# Patient Record
Sex: Male | Born: 1965 | Race: White | Hispanic: No | Marital: Single | Smoking: Current every day smoker
Health system: Southern US, Community
[De-identification: ages and names within clinical notes are randomized; demographics above are authoritative.]

## PROBLEM LIST (undated history)

## (undated) DIAGNOSIS — F429 Obsessive-compulsive disorder, unspecified: Secondary | ICD-10-CM

## (undated) DIAGNOSIS — K219 Gastro-esophageal reflux disease without esophagitis: Secondary | ICD-10-CM

## (undated) DIAGNOSIS — F319 Bipolar disorder, unspecified: Secondary | ICD-10-CM

## (undated) DIAGNOSIS — K6289 Other specified diseases of anus and rectum: Secondary | ICD-10-CM

## (undated) HISTORY — DX: Gastro-esophageal reflux disease without esophagitis: K21.9

## (undated) HISTORY — PX: WISDOM TOOTH EXTRACTION: SHX21

## (undated) HISTORY — PX: COLONOSCOPY: SHX5424

## (undated) HISTORY — DX: Bipolar disorder, unspecified: F31.9

## (undated) HISTORY — DX: Obsessive-compulsive disorder, unspecified: F42.9

---

## 2006-07-16 ENCOUNTER — Emergency Department (HOSPITAL_COMMUNITY): Admission: EM | Admit: 2006-07-16 | Discharge: 2006-07-16 | Payer: Self-pay | Admitting: Family Medicine

## 2007-01-12 ENCOUNTER — Emergency Department (HOSPITAL_COMMUNITY): Admission: EM | Admit: 2007-01-12 | Discharge: 2007-01-12 | Payer: Self-pay | Admitting: Emergency Medicine

## 2007-06-17 ENCOUNTER — Emergency Department (HOSPITAL_COMMUNITY): Admission: EM | Admit: 2007-06-17 | Discharge: 2007-06-17 | Payer: Self-pay | Admitting: Emergency Medicine

## 2007-06-20 ENCOUNTER — Ambulatory Visit: Payer: Self-pay | Admitting: Family Medicine

## 2007-12-13 ENCOUNTER — Ambulatory Visit: Payer: Self-pay | Admitting: Family Medicine

## 2008-04-02 ENCOUNTER — Ambulatory Visit: Payer: Self-pay | Admitting: Family Medicine

## 2008-09-12 ENCOUNTER — Ambulatory Visit: Payer: Self-pay | Admitting: Family Medicine

## 2008-09-26 ENCOUNTER — Ambulatory Visit: Payer: Self-pay | Admitting: Family Medicine

## 2008-11-26 ENCOUNTER — Ambulatory Visit: Payer: Self-pay | Admitting: Family Medicine

## 2008-12-24 ENCOUNTER — Ambulatory Visit: Payer: Self-pay | Admitting: Family Medicine

## 2009-05-01 ENCOUNTER — Emergency Department (HOSPITAL_COMMUNITY): Admission: EM | Admit: 2009-05-01 | Discharge: 2009-05-01 | Payer: Self-pay | Admitting: Family Medicine

## 2009-05-03 ENCOUNTER — Ambulatory Visit: Payer: Self-pay | Admitting: Family Medicine

## 2009-07-15 ENCOUNTER — Ambulatory Visit: Payer: Self-pay | Admitting: Family Medicine

## 2009-08-07 ENCOUNTER — Ambulatory Visit: Payer: Self-pay | Admitting: Physician Assistant

## 2010-07-17 ENCOUNTER — Encounter: Payer: Self-pay | Admitting: Family Medicine

## 2010-11-25 LAB — POCT URINALYSIS DIP (DEVICE)
Nitrite: NEGATIVE
Urobilinogen, UA: 0.2
pH: 6.5

## 2010-11-25 LAB — POCT I-STAT, CHEM 8
Calcium, Ion: 1.2
Chloride: 103
HCT: 41
Potassium: 4.1
Sodium: 140

## 2010-11-25 LAB — DIFFERENTIAL
Basophils Absolute: 0.1
Basophils Relative: 1
Neutro Abs: 7.5
Neutrophils Relative %: 64

## 2010-11-25 LAB — CBC
MCHC: 34
Platelets: 234
RDW: 13

## 2011-02-04 ENCOUNTER — Encounter: Payer: Self-pay | Admitting: Medical

## 2011-02-04 ENCOUNTER — Ambulatory Visit (INDEPENDENT_AMBULATORY_CARE_PROVIDER_SITE_OTHER): Payer: BC Managed Care – PPO | Admitting: Medical

## 2011-02-04 VITALS — BP 122/82 | HR 68 | Temp 97.9°F | Resp 16 | Wt 172.0 lb

## 2011-02-04 DIAGNOSIS — B09 Unspecified viral infection characterized by skin and mucous membrane lesions: Secondary | ICD-10-CM

## 2011-02-04 DIAGNOSIS — Z113 Encounter for screening for infections with a predominantly sexual mode of transmission: Secondary | ICD-10-CM

## 2011-02-04 DIAGNOSIS — N529 Male erectile dysfunction, unspecified: Secondary | ICD-10-CM | POA: Insufficient documentation

## 2011-02-04 DIAGNOSIS — B088 Other specified viral infections characterized by skin and mucous membrane lesions: Secondary | ICD-10-CM

## 2011-02-04 LAB — COMPREHENSIVE METABOLIC PANEL
ALT: 12 U/L (ref 0–53)
CO2: 27 mEq/L (ref 19–32)
Chloride: 103 mEq/L (ref 96–112)
Potassium: 4.1 mEq/L (ref 3.5–5.3)
Sodium: 139 mEq/L (ref 135–145)
Total Bilirubin: 0.3 mg/dL (ref 0.3–1.2)
Total Protein: 7.3 g/dL (ref 6.0–8.3)

## 2011-02-04 LAB — CBC
MCH: 32.3 pg (ref 26.0–34.0)
MCV: 92.3 fL (ref 78.0–100.0)
Platelets: 306 10*3/uL (ref 150–400)
RDW: 12.5 % (ref 11.5–15.5)
WBC: 7 10*3/uL (ref 4.0–10.5)

## 2011-02-04 LAB — POCT URINALYSIS DIPSTICK
Bilirubin, UA: NEGATIVE
Blood, UA: NEGATIVE
Glucose, UA: NEGATIVE
Spec Grav, UA: <=1.005

## 2011-02-04 LAB — LIPID PANEL
Cholesterol: 211 mg/dL — ABNORMAL HIGH (ref 0–200)
Triglycerides: 107 mg/dL (ref <150)

## 2011-02-04 LAB — TESTOSTERONE: Testosterone: 304.83 ng/dL (ref 250–890)

## 2011-02-04 NOTE — Progress Notes (Signed)
Subjective:   HPI  Marcus Powell is a 45 y.o. male who presents for multiple c/o.  Last visit here early 2011.    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  They keep occuring with some frequency.  One will resolve after 1-2 wk, then he will get another.  Denies hx/o bowel disease, however he has had colonoscopy in 2010 for blood in stool. Denies hx/o cold sores, HIV or other immunocompromised state.   No other aggravating or relieving factors.  He reports problems with erections x months.  He still has some erections in the mornings, but they are not full.  Is able to get some erections with intercourse but not full, but sometimes not able to get erections at all.  Has never been evaluated for this.  Denies any changes or decrease in libido.  He is sexually active with women only.  He sees somebody regularly.  1 partner now, but hx/o multiple partners prior.  Denies prior STD screening.  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  No other c/o.  The following portions of the patient's history were reviewed and updated as appropriate: allergies, current medications, past family history, past medical history, past social history, past surgical history and problem list.   Review of Systems Constitutional: -fever, -chills, -sweats, -unexpected -weight change,-fatigue ENT: -runny nose, -ear pain, -sore throat Cardiology:  -chest pain, -palpitations, -edema Respiratory: -cough, -shortness of breath, -wheezing Gastroenterology: -abdominal pain, -nausea, -vomiting, -diarrhea, -constipation Hematology: -bleeding or bruising problems Musculoskeletal: -arthralgias, -myalgias, -joint swelling, -back pain Ophthalmology: -vision changes Urology: -dysuria, -difficulty urinating, -hematuria, -urinary frequency, -urgency Neurology: -headache, -weakness, -tingling, -numbness     Objective:   Physical Exam  Filed Vitals:   02/04/11 1459  BP: 122/82  Pulse: 68  Temp:  97.9 F (36.6 C)  Resp: 16    General appearance: alert, no distress, WD/WN, white male HEENT: normocephalic, sclerae anicteric, TMs pearly, nares patent, no discharge or erythema, pharynx normal Oral cavity: MMM, 1 ulcerated pink lesion 3mm x 2mm just inside left upper lip in oral mucosa Neck: supple, no lymphadenopathy, no thyromegaly, no masses Heart: RRR, normal S1, S2, no murmurs Lungs: CTA bilaterally, no wheezes, rhonchi, or rales Abdomen: +bs, soft, non tender, non distended, no masses, no hepatomegaly, no splenomegaly Pulses: 2+ symmetric GU: normal male external genitalia, no hernia, no mass, no lesions, no lymphadenopathy   Assessment and Plan :    Encounter Diagnoses  Name Primary?  . Viral infection characterized by skin and mucous membrane lesions Yes  . Erectile dysfunction   . Screen for STD (sexually transmitted disease)    Mouth ulcers likely aphthous ulcer.  Discussed usual course of these, advised salt water gargles, but no other specific treatment.    ED - labs today.  Discussed possible etiologies, and if labs normal, consider trial of Viagra.   Follow-up pending labs.

## 2011-02-05 ENCOUNTER — Other Ambulatory Visit: Payer: Self-pay | Admitting: Medical

## 2011-02-05 LAB — HSV(HERPES SIMPLEX VRS) I + II AB-IGM: Herpes Simplex Vrs I&II-IgM Ab (EIA): 0.31 INDEX

## 2011-02-05 LAB — GC/CHLAMYDIA PROBE AMP, URINE: Chlamydia, Swab/Urine, PCR: NEGATIVE

## 2011-02-05 LAB — HSV(HERPES SIMPLEX VRS) I + II AB-IGG: HSV 2 Glycoprotein G Ab, IgG: 0.19 IV

## 2011-02-05 MED ORDER — SILDENAFIL CITRATE 50 MG PO TABS: ORAL_TABLET | ORAL | Status: DC

## 2011-03-09 ENCOUNTER — Ambulatory Visit: Payer: BC Managed Care – PPO | Admitting: Medical

## 2011-04-29 ENCOUNTER — Ambulatory Visit (INDEPENDENT_AMBULATORY_CARE_PROVIDER_SITE_OTHER): Payer: BC Managed Care – PPO | Admitting: Medical

## 2011-04-29 ENCOUNTER — Encounter: Payer: Self-pay | Admitting: Medical

## 2011-04-29 VITALS — BP 130/90 | HR 60 | Temp 98.0°F | Resp 16 | Wt 178.0 lb

## 2011-04-29 DIAGNOSIS — Z726 Gambling and betting: Secondary | ICD-10-CM

## 2011-04-29 DIAGNOSIS — F429 Obsessive-compulsive disorder, unspecified: Secondary | ICD-10-CM

## 2011-04-29 DIAGNOSIS — F319 Bipolar disorder, unspecified: Secondary | ICD-10-CM | POA: Insufficient documentation

## 2011-04-29 DIAGNOSIS — F630 Pathological gambling: Secondary | ICD-10-CM

## 2011-04-29 MED ORDER — RISPERIDONE 0.5 MG PO TABS: 0.5000 mg | ORAL_TABLET | Freq: Two times a day (BID) | ORAL | Status: DC

## 2011-04-29 MED ORDER — FLUVOXAMINE MALEATE 50 MG PO TABS: 50.0000 mg | ORAL_TABLET | Freq: Every day | ORAL | Status: DC

## 2011-04-29 NOTE — Progress Notes (Signed)
  Subjective:   HPI  Marcus Powell is a 46 y.o. male who presents with concerns about his mood and need to get back on medication.   He notes a history of bipolar, OCD, and was hospitalized years ago at [facility].  At that time he saw Dr. Dub Mikes who started him on Luvox. He can't recall whether he was on other medications or not.  He has been on Wellbutrin for smoking cessation.  He had taken Luvox for quite some time and because he was doing so well he decided to stop the medication. This was back in 2011.  In the last few months, he notes that he has become irritable, has impulsivity particularly with gambling.  He has been gambling every weekend or when he has time.  This sometimes interferes with his financial obligations.  He has also had some depressed mood, mood swings, racing thoughts, lashes out at family and friends, decreased appetite, sleep is not good. However he denies going days without sleeping.  He denies any hallucinations or delusions. He denies any suicidal or homicidal behavior.  He is single, lives with his mom and sister.  He does have a girlfriend, and their relationship is fine. He notes that family and friends have told him that he needs to go be put on medication due to his irritability and mood of late.    He is a city [employer], is not exercising particularly. He denies any other prior psychiatric counseling evaluation in the past.  No other aggravating or relieving factors.    No other c/o.  The following portions of the patient's history were reviewed and updated as appropriate: allergies, current medications, past family history, past medical history, past social history, past surgical history and problem list.  Past Medical History  Diagnosis Date  . GERD (gastroesophageal reflux disease)   . Bipolar disorder     hx/o hospitalization - [facility]  . OCD (obsessive compulsive disorder)     No Known Allergies   Review of Systems ROS  reviewed and was negative other than noted in HPI or above.    Objective:   Physical Exam  General appearance: alert, no distress, WD/WN  psychiatric:  Answers questions or pearly, good eye contact, pleasant, calm  Assessment and Plan :     Encounter Diagnoses  Name Primary?  . Bipolar disorder Yes  . OCD (obsessive compulsive disorder)   . Gambling    I reviewed a pH q. 9 depression scale today with a score of 15, mood disorder questionnaire positive for 5 answers.  Discussed his symptoms and concerns, discussed medications, risk and benefits of medications.  We will start him initially on risperidone 0.5 mg twice a day.  After a week he can add on Luvox 50 mg each bedtime.  He will work on his behavior, irritability, and lashing out.  Advise that if he has any suicidal or homicidal thoughts or severely depressed mood, to call, return, or go to emergency department right away.   Also recommend he consider gambler's anonymous.  Handout given today. He will call report in 1 week and let me know how he is doing.  Return to clinic 2-3 wks.

## 2011-04-29 NOTE — Patient Instructions (Signed)
Begin Risperidone 0.5 mg, 1 tablet once daily for 3 days, then go to twice daily.   After being on this for an entire week at twice daily, and if not having any problems with this, then begin Luvox 50mg  tablet daily at bedtime.   I want you to call back in 1 week and let me know if you are having any problems with the medication.    Side effects can vary to none to nausea, and even strange or ever serious side effects.  If you notice anything not feeling right on the medication, call right away.   If you feel suicidal, feel like hurting somebody, or feel depressed, call or come in right away or go the emergency department.    Lets have you return in 2-3 weeks for recheck.   Consider calling Gamblers Anonymous for help.  Compulsive Gambling Compulsive gambling is gambling that becomes more and more hard to resist. Compulsive gambling is done at the expense of your job and your relationships, including your family and friends. It is done in spite of financial difficulties and inability to afford it. SIGNS OF COMPULSIVE GAMBLING  There may be increased frequency of gambling with higher and higher stakes.   There is a craving present which can only be satisfied by gambling again.   You develop a "rush" or warm, satisfied feeling when you are gambling.   There is a loss of self-esteem after you lose, yet you keep gambling to satisfy the craving.   You may resort to illegal activities to support the habit.  STAGES OF COMPULSIVE GAMBLING  The Winning Stage. This is where you win more often than you lose. It is likely a major, early win made you feel smarter than other gamblers. This winning justified your behavior. You believed you were a superior gambler or possibly even a "professional gambler."   The Losing Stage. Over time, you begin to bet higher and higher amounts. You begin to lose more often than you win, but you believe it is just a losing streak. With more losses, you bet even more in  an effort to win back the money you lost. You begin to borrow money for gambling and lie about losses. Others start refusing to help you out financially. Eventually, your problems become unmanageable and relationships with others begin to fall apart.   The Desperation Stage. By this time, you no longer have control of your gambling. You may engage in illegal behavior, such as stealing, to try to cover gambling debts. You continue to lie about the extent of your activities and start blaming others for your problems. You may find yourself living with deep anguish. You may think about suicide as a way to stop the pain. You may be forced into recovery by family, an employer, or the criminal justice system. You might enter a self-help recovery program. You could face relapses if you do not take the treatment program seriously. You spend most of your time:   Thinking about gambling.   Planning ways to get money to gamble.   Gambling.   The Hopeless Stage. If you do not get a strong recovery program working in your life, you may come to believe that nothing can help. You may feel like you do not care if you live or die. Depression deepens. At this time, a person either gets help or the problems could get worse, possibly ending in suicide or prison.  TREATMENT   Compulsive gambling is a common  problem. There are groups available for support, such as Gamblers Anonymous.   Psychotherapy may also be helpful.   Medicines are available which may help satisfy the craving to gamble.  Document Released: 11/11/2000 Document Revised: 10/29/2010 Document Reviewed: 01/11/2007 [facility] Patient Information 2012 [place], [place].

## 2011-05-11 ENCOUNTER — Encounter: Payer: Self-pay | Admitting: Internal Medicine

## 2011-05-19 ENCOUNTER — Encounter: Payer: Self-pay | Admitting: Medical

## 2011-05-19 ENCOUNTER — Other Ambulatory Visit: Payer: Self-pay | Admitting: Medical

## 2011-05-19 ENCOUNTER — Ambulatory Visit (INDEPENDENT_AMBULATORY_CARE_PROVIDER_SITE_OTHER): Payer: BC Managed Care – PPO | Admitting: Medical

## 2011-05-19 VITALS — BP 100/70 | HR 100 | Temp 97.8°F | Resp 16 | Wt 179.0 lb

## 2011-05-19 DIAGNOSIS — F319 Bipolar disorder, unspecified: Secondary | ICD-10-CM

## 2011-05-19 DIAGNOSIS — F429 Obsessive-compulsive disorder, unspecified: Secondary | ICD-10-CM

## 2011-05-19 DIAGNOSIS — J329 Chronic sinusitis, unspecified: Secondary | ICD-10-CM

## 2011-05-19 MED ORDER — AMOXICILLIN 875 MG PO TABS: 875.0000 mg | ORAL_TABLET | Freq: Two times a day (BID) | ORAL | Status: AC

## 2011-05-19 MED ORDER — FLUVOXAMINE MALEATE 25 MG PO TABS: ORAL_TABLET | ORAL | Status: DC

## 2011-05-19 NOTE — Progress Notes (Signed)
  Subjective:   HPI  Marcus Powell is a 46 y.o. male who presents with recheck.    Here for bad cold.   He notes 1+ week of sinus congestion, head and chest congestion, coughing up phlegm that is green.  Denies fever, but +chills.  He has had some nausea, right ear pain, sore throat.  Denies sick contacts.  Using some cough medication over the counter.    Here for recheck on mood swings, depressed mood, OCD.  At last visit I started him on risperidone 0.5 mg twice a day, and had him restart Luvox a week later.  He seems be doing well on the risperidone, has less irritability, less problems with his mood now, hasn't noticed any mood swings, no more racing thoughts.  However he still has impulsivity with the desire to gamble. He could not tolerate Luvox as it made him have no appetite at all.  Overall, he thinks he is doing better with the medication.  No other c/o.  The following portions of the patient's history were reviewed and updated as appropriate: allergies, current medications, past family history, past medical history, past social history, past surgical history and problem list.  Past Medical History  Diagnosis Date  . GERD (gastroesophageal reflux disease)   . Bipolar disorder     hx/o hospitalization - [facility]  . OCD (obsessive compulsive disorder)     No Known Allergies   Review of Systems ROS reviewed and was negative other than noted in HPI or above.    Objective:   Physical Exam  General appearance: alert, no distress, WD/WN  psychiatric:  Answers questions or pearly, good eye contact, pleasant, calm HEENT: mild sinus tenderness, TMs pearly, nares with clear discharge, no pharyngeal erythema Heart: RRR, normal S1, S2 Lungs: CTA bilat    Assessment and Plan :     Encounter Diagnoses  Name Primary?  . Bipolar disorder Yes  . OCD (obsessive compulsive disorder)   . Sinusitis    Bipolar - improving with Risperidone 0.5mg  BID.  Advise that if he has any  suicidal or homicidal thoughts or severely depressed mood, to call, return, or go to emergency department right away.   OCD - restart Luvox 25mg , 1/2 tablet daily.  Call report 2wk on how he is doing.   Call/return otherwise as needed.  Sinusitis - amoxicillin, Mucinex, rest, hydrate well

## 2011-06-05 ENCOUNTER — Telehealth: Payer: Self-pay | Admitting: Internal Medicine

## 2011-06-05 NOTE — Telephone Encounter (Signed)
1) call and see how he is doing on the Risperidone?  Does he see a big improvmeent in mood swings and urges to gamble? 2) does the medication make him sleepy?

## 2011-06-08 ENCOUNTER — Encounter: Payer: Self-pay | Admitting: Family Medicine

## 2011-06-08 ENCOUNTER — Telehealth: Payer: Self-pay | Admitting: Family Medicine

## 2011-06-08 NOTE — Telephone Encounter (Signed)
Patient states that the medication is fine and it is helping. He said the medication is not making him sleepy. He states that he doesn't have a problem with this medication. He needs a letter stating that it is all right for him to drive while taking this medication. CLS

## 2011-06-08 NOTE — Telephone Encounter (Signed)
Letter sent after Kristian Covey approval

## 2011-06-08 NOTE — Telephone Encounter (Signed)
done

## 2011-06-08 NOTE — Telephone Encounter (Signed)
Pt called stating he needs a letter that says his driving is not affected by the Resperidone or the Fluvoxamine and needs it faxed asap to [phone] Attn:  Katharina Caper.

## 2011-06-25 ENCOUNTER — Other Ambulatory Visit: Payer: Self-pay | Admitting: Medical

## 2011-06-25 NOTE — Telephone Encounter (Signed)
Is this ok?

## 2011-08-21 ENCOUNTER — Other Ambulatory Visit: Payer: Self-pay | Admitting: Medical

## 2011-08-21 NOTE — Telephone Encounter (Signed)
Needs recheck OV

## 2011-08-21 NOTE — Telephone Encounter (Signed)
RX REFILL 

## 2011-08-21 NOTE — Telephone Encounter (Signed)
PATIENT WAS NOTIFIED THAT HE NEEDED A OFFICE SO HE SCHEDULED AN APPOINTMENT FOR NEXT Tuesday.CLS

## 2011-08-21 NOTE — Telephone Encounter (Signed)
DONE

## 2011-08-25 ENCOUNTER — Ambulatory Visit (INDEPENDENT_AMBULATORY_CARE_PROVIDER_SITE_OTHER): Payer: BC Managed Care – PPO | Admitting: Medical

## 2011-08-25 ENCOUNTER — Encounter: Payer: Self-pay | Admitting: Medical

## 2011-08-25 VITALS — BP 120/80 | HR 92 | Temp 97.7°F | Resp 16 | Wt 180.0 lb

## 2011-08-25 DIAGNOSIS — F429 Obsessive-compulsive disorder, unspecified: Secondary | ICD-10-CM

## 2011-08-25 DIAGNOSIS — F3189 Other bipolar disorder: Secondary | ICD-10-CM

## 2011-08-25 DIAGNOSIS — F3181 Bipolar II disorder: Secondary | ICD-10-CM

## 2011-08-25 MED ORDER — RISPERIDONE 0.5 MG PO TABS: 0.5000 mg | ORAL_TABLET | Freq: Two times a day (BID) | ORAL | Status: DC

## 2011-08-25 NOTE — Progress Notes (Signed)
Subjective: Here at my request for f/u on bipolar, OCD and medication.  Since beginning Risperidone, he has done well.  He originally came to me in February for concerns about OCD and gambling, and hx/o bipolar with issues with sleep and depression at that time.  He has since done really well on the medication.  Currently, he denies gambling in quite some time (months), has no desire for gambling currently.  Mood is ok, denies depression currently.  Sleep is fine.  He denies agitation, isn't easily angered, and he is happy most of the time.  He doesn't let things get to him, takes things in stride compared to prior.  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  He lives with his mother and his girlfriend.  They have not commented on his behavior. Back in February his mother said he was mean and hateful, but in months she hasn't made these comments. Overall feels like the medication has been a big help.  No other new c/o.   Objective: Gen: wd, wn, nad Psych: pleasant, answers questions appropriately, good eye contact  Review of Systems Constitutional: -fever, -chills, -sweats, -unexpected -weight change,-fatigue Cardiology:  -chest pain, -palpitations, -edema Respiratory: -cough, -shortness of breath, -wheezing Gastroenterology: -abdominal pain, -nausea, -vomiting, -diarrhea, -constipation Hematology: -bleeding or bruising problems Musculoskeletal: -arthralgias, -myalgias, -joint swelling, -back pain Ophthalmology: -vision changes Urology: -dysuria, -difficulty urinating, -hematuria, -urinary frequency, -urgency Neurology: -headache, -weakness, -tingling, -numbness  Psych: no suicidal or homicidal ideation, no sleep problems  Assessment: Encounter Diagnoses  Name Primary?  . Bipolar 2 disorder Yes  . OCD (obsessive compulsive disorder)    Plan: Doing well on current medication regimen.  C/t Risperidone 0.5mg  BID.  Advised he eat 3 meals daily vs 2 meals daily, exercise  regularly.  He has access to gym for free through work.   Discussed medication f/u, avoiding gambling, and to return if issues with sleep, agitation, depression, etc.  F/u 138mo or sooner prn.  RTC 138mo for recheck and physical.

## 2011-08-26 ENCOUNTER — Encounter: Payer: Self-pay | Admitting: Medical

## 2011-09-24 ENCOUNTER — Other Ambulatory Visit: Payer: Self-pay | Admitting: Medical

## 2011-09-25 NOTE — Telephone Encounter (Signed)
Is this okay?

## 2011-12-22 ENCOUNTER — Other Ambulatory Visit: Payer: Self-pay | Admitting: Medical

## 2011-12-23 ENCOUNTER — Other Ambulatory Visit: Payer: Self-pay | Admitting: Medical

## 2011-12-23 NOTE — Telephone Encounter (Signed)
RX refill

## 2012-05-14 ENCOUNTER — Other Ambulatory Visit: Payer: Self-pay | Admitting: Medical

## 2012-05-16 NOTE — Telephone Encounter (Signed)
I left a message with the patients wife for the patient to call and schedule a OV. CLS

## 2012-05-16 NOTE — Telephone Encounter (Signed)
Needs OV recheck

## 2012-05-16 NOTE — Telephone Encounter (Signed)
Is this okay to refill? 

## 2013-05-12 ENCOUNTER — Encounter: Payer: Self-pay | Admitting: Medical

## 2013-05-12 ENCOUNTER — Ambulatory Visit (INDEPENDENT_AMBULATORY_CARE_PROVIDER_SITE_OTHER): Payer: BC Managed Care – PPO | Admitting: Medical

## 2013-05-12 VITALS — BP 122/80 | HR 78 | Temp 97.8°F | Resp 14 | Wt 183.0 lb

## 2013-05-12 DIAGNOSIS — F17200 Nicotine dependence, unspecified, uncomplicated: Secondary | ICD-10-CM

## 2013-05-12 DIAGNOSIS — R059 Cough, unspecified: Secondary | ICD-10-CM

## 2013-05-12 DIAGNOSIS — J329 Chronic sinusitis, unspecified: Secondary | ICD-10-CM

## 2013-05-12 DIAGNOSIS — F319 Bipolar disorder, unspecified: Secondary | ICD-10-CM

## 2013-05-12 DIAGNOSIS — R05 Cough: Secondary | ICD-10-CM

## 2013-05-12 MED ORDER — HYDROCODONE-HOMATROPINE 5-1.5 MG/5ML PO SYRP: 5.0000 mL | ORAL_SOLUTION | Freq: Three times a day (TID) | ORAL | Status: DC | PRN

## 2013-05-12 MED ORDER — AZELASTINE-FLUTICASONE 137-50 MCG/ACT NA SUSP: 1.0000 | Freq: Two times a day (BID) | NASAL | Status: DC

## 2013-05-12 MED ORDER — AMOXICILLIN 500 MG PO TABS: ORAL_TABLET | ORAL | Status: DC

## 2013-05-12 MED ORDER — RISPERIDONE 0.5 MG PO TABS: 0.5000 mg | ORAL_TABLET | Freq: Two times a day (BID) | ORAL | Status: DC

## 2013-05-12 NOTE — Progress Notes (Signed)
Subjective:  Marcus Powell is a 48 y.o. male who presents for cough and sinuses.  Symptoms include cough, coughing to the point of feeling like he is choking, sinuses blocked up, has had some sore throat, ear pressure, teeth pain, cough is productive, green sputum, sneezing.  Denies fever, NVD, no SOB or wheezing, no decreased sense of smell or taste.  No itchy or water eyes.  Patient is a smoker.  Using benadryl for symptoms.  Denies sick contacts.  Here with girlfriend.  He was doing well on Risperdal, but stopped it a while back as he "felt good."   No other aggravating or relieving factors.  No other c/o.  ROS as in subjective   Objective: Filed Vitals:   05/12/13 1054  BP: 122/80  Pulse: 78  Temp: 97.8 F (36.6 C)  Resp: 14    General appearance: Alert, WD/WN, no distress                             Skin: warm, no rash                           Head: +mild maxillary sinus tenderness,                            Eyes: conjunctiva normal, corneas clear, PERRLA                            Ears: pearly TMs, external ear canals normal                          Nose: septum midline, turbinates swollen, with erythema and clear discharge             Mouth/throat: MMM, tongue normal, mild pharyngeal erythema                           Neck: supple, no adenopathy, no thyromegaly, nontender                          Heart: RRR, normal S1, S2, no murmurs                         Lungs: CTA bilaterally, no wheezes, rales, or rhonchi      Assessment and Plan: Encounter Diagnoses  Name Primary?  . Sinusitis Yes  . Cough   . Smoker   . Bipolar disorder, unspecified    Sinusitis, cough - Prescription given for Amoxicillin.  Begin Dymista nasal spray, anthistmiane QHS, can use  Tylenol or Ibuprofen OTC for fever and malaise.  Can use Hycodan syrup for worse cough.  Advised he stop tobacco.  Discussed symptomatic relief, nasal saline flush, and call or return if worse or not improving in 2-3 days.     Bipolar - restart Risperdal, f/u in 2wk to discuss further and smoking cessation.

## 2013-05-26 ENCOUNTER — Ambulatory Visit (INDEPENDENT_AMBULATORY_CARE_PROVIDER_SITE_OTHER): Payer: BC Managed Care – PPO | Admitting: Medical

## 2013-05-26 ENCOUNTER — Telehealth: Payer: Self-pay | Admitting: Family Medicine

## 2013-05-26 ENCOUNTER — Encounter: Payer: Self-pay | Admitting: Medical

## 2013-05-26 VITALS — BP 110/80 | HR 80 | Temp 97.6°F | Resp 14 | Wt 184.0 lb

## 2013-05-26 DIAGNOSIS — F319 Bipolar disorder, unspecified: Secondary | ICD-10-CM

## 2013-05-26 DIAGNOSIS — J309 Allergic rhinitis, unspecified: Secondary | ICD-10-CM

## 2013-05-26 DIAGNOSIS — K219 Gastro-esophageal reflux disease without esophagitis: Secondary | ICD-10-CM

## 2013-05-26 DIAGNOSIS — F17200 Nicotine dependence, unspecified, uncomplicated: Secondary | ICD-10-CM

## 2013-05-26 MED ORDER — RISPERIDONE 0.5 MG PO TABS: 0.5000 mg | ORAL_TABLET | Freq: Two times a day (BID) | ORAL | Status: DC

## 2013-05-26 MED ORDER — OMEPRAZOLE 40 MG PO CPDR: 40.0000 mg | DELAYED_RELEASE_CAPSULE | Freq: Every day | ORAL | Status: DC

## 2013-05-26 MED ORDER — VARENICLINE TARTRATE 0.5 MG X 11 & 1 MG X 42 PO MISC: ORAL | Status: DC

## 2013-05-26 MED ORDER — BECLOMETHASONE DIPROPIONATE 80 MCG/ACT NA AERS: 2.0000 | INHALATION_SPRAY | Freq: Two times a day (BID) | NASAL | Status: DC

## 2013-05-26 NOTE — Addendum Note (Signed)
Addended by: Jac CanavanYSINGER, DAVID S on: 05/26/2013 11:11 AM   Modules accepted: Orders

## 2013-05-26 NOTE — Telephone Encounter (Signed)
Message copied by Janeice RobinsonSCALES, Ziaire Hagos L on Fri May 26, 2013  4:07 PM ------      Message from: Aleen CampiYSINGER, Kermit BaloAVID S      Created: Fri May 26, 2013 11:11 AM       I forgot to tell him this, but he can begin Omeprazole prescription once daily about 30-45 minutes before breakfast for heartburn/GERD.  Lets see how this helps.  Avoid or limit food triggers - citrus, spicy foods, acidic foods, caffeine.  ------

## 2013-05-26 NOTE — Telephone Encounter (Signed)
Patients wife is [facility]. CLS

## 2013-05-26 NOTE — Progress Notes (Addendum)
   Subjective:   Marcus Powell is a 48 y.o. male presenting on 05/26/2013 with Follow-up  Here for recheck, accompanied by spouse.     He did restart Risperdal and already spouse notes improvement in irritability and snapping at times.   He does get mood swings, sometimes down in mood.  When worse is introverted stays to himself. No legal problems, no hx/o mania.  In the past has always done well on medication.  No SI/HI.  Wants to go back on chantix for tobacco.  Has done well on this prior.  No prior problems with the medication.  Still having nose stopped up, coughing from post nasal drainage.  GERD - having issues with this for a while now, heartburn.   No other complaint.  Review of Systems ROS as in subjective      Objective:    Filed Vitals:   05/26/13 1035  BP: 110/80  Pulse: 80  Temp: 97.6 F (36.4 C)  Resp: 14    General appearance: alert, no distress, WD/WN HEENT: normocephalic, sclerae anicteric, TMs pearly, nares bilat turbinated edema, clear discharge, pharynx normal Oral cavity: MMM, no lesions Neck: supple, no lymphadenopathy, no thyromegaly, no masses Heart: RRR, normal S1, S2, no murmurs Lungs: CTA bilaterally, no wheezes, rhonchi, or rales Pulses: 2+ symmetric, upper and lower extremities, normal cap refill Psych: pleasant, good eye contact, answers questions approapriatley     Assessment: Encounter Diagnoses  Name Primary?  . Bipolar disorder, unspecified Yes  . Tobacco use disorder   . Allergic rhinitis   . GERD (gastroesophageal reflux disease)      Plan: Bipolar - begin back on Risperdal.  Discussed reasons for the medication, goals of medication, diagnosis of bipolar, primarily depression type.  F/u 1-3 mo.  Return sooner for any reason.  Tobacco use - begin back on Chantix, discussed risks/benefits of medication, potential dangers with bipolar and medication interactions with risperdal.   Spouse to also help watch for side effects.   F/u with call back in 2-3 wk, recheck 1-578mo  Allergic rhinitis - not much improvement on Dymista.  Change to Qnasal, c/t QHS antihistamine, avoid triggers, begin daily nasal saline flush  GERD - begin omeprazole  Marcus Powell was seen today for follow-up.  Diagnoses and associated orders for this visit:  Bipolar disorder, unspecified  Tobacco use disorder  Allergic rhinitis  GERD (gastroesophageal reflux disease)  Other Orders - Discontinue: risperiDONE (RISPERDAL) 0.5 MG tablet; Take 1 tablet (0.5 mg total) by mouth 2 (two) times daily. - varenicline (CHANTIX STARTING MONTH PAK) 0.5 MG X 11 & 1 MG X 42 tablet; Take one 0.5 mg tablet by mouth once daily for 3 days, then increase to one 0.5 mg tablet twice daily for 4 days, then increase to one 1 mg tablet twice daily. - Discontinue: risperiDONE (RISPERDAL) 0.5 MG tablet; Take 1 tablet (0.5 mg total) by mouth 2 (two) times daily. - Beclomethasone Dipropionate (QNASL) 80 MCG/ACT AERS; Place 2 sprays into the nose 2 (two) times daily. - risperiDONE (RISPERDAL) 0.5 MG tablet; Take 1 tablet (0.5 mg total) by mouth 2 (two) times daily. - risperiDONE (RISPERDAL) 0.5 MG tablet; Take 1 tablet (0.5 mg total) by mouth 2 (two) times daily. - omeprazole (PRILOSEC) 40 MG capsule; Take 1 capsule (40 mg total) by mouth daily.    Return in about 3 months (around 08/26/2013).

## 2013-05-26 NOTE — Patient Instructions (Signed)
YOU CAN QUIT SMOKING!  Talk to your medical provider about using medicines to help you quit. These include nicotine replacement gum, lozenges, or skin patches.  Consider calling 1-800-QUIT-NOW, a toll free 24/7 hotline with free counseling to help you quit.  If you are ready to quit smoking or are thinking about it, congratulations! You have chosen to help yourself be healthier and live longer! There are lots of different ways to quit smoking. Nicotine gum, nicotine patches, a nicotine inhaler, or nicotine nasal spray can help with physical craving. Hypnosis, support groups, and medicines help break the habit of smoking. TIPS TO GET OFF AND STAY OFF CIGARETTES  Learn to predict your moods. Do not let a bad situation be your excuse to have a cigarette. Some situations in your life might tempt you to have a cigarette.   Ask friends and co-workers not to smoke around you.   Make your home smoke-free.   Never have "just one" cigarette. It leads to wanting another and another. Remind yourself of your decision to quit.   On a card, make a list of your reasons for not smoking. Read it at least the same number of times a day as you have a cigarette. Tell yourself everyday, "I do not want to smoke. I choose not to smoke."   Ask someone at home or work to help you with your plan to quit smoking.   Have something planned after you eat or have a cup of coffee. Take a walk or get other exercise to perk you up. This will help to keep you from overeating.   Try a relaxation exercise to calm you down and decrease your stress. Remember, you may be tense and nervous the first two weeks after you quit. This will pass.   Find new activities to keep your hands busy. Play with a pen, coin, or rubber band. Doodle or draw things on paper.   Brush your teeth right after eating. This will help cut down the craving for the taste of tobacco after meals. You can try mouthwash too.   Try gum, breath mints, or diet  candy to keep something in your mouth.  IF YOU SMOKE AND WANT TO QUIT:  Do not stock up on cigarettes. Never buy a carton. Wait until one pack is finished before you buy another.   Never carry cigarettes with you at work or at home.   Keep cigarettes as far away from you as possible. Leave them with someone else.   Never carry matches or a lighter with you.   Ask yourself, "Do I need this cigarette or is this just a reflex?"   Bet with someone that you can quit. Put cigarette money in a piggy bank every morning. If you smoke, you give up the money. If you do not smoke, by the end of the week, you keep the money.   Keep trying. It takes 21 days to change a habit!  Document Released: 12/13/2008 Document Revised: 10/29/2010 Document Reviewed: 12/13/2008 [facility] Patient Information 2012 [place], [place].    Using Saline Nose Drops with Bulb Syringe A bulb syringe is used to clear your nose. You may use it when you have a stuffy nose, nasal congestion, sinus pressure, or sneezing.   SALINE SOLUTION You can buy nose drops at your local drug store. You can also make nose drops yourself. Mix 1 cup of water with  teaspoon of salt. Stir. Store this mixture at room temperature. Make a new batch  daily.  USE THE BULB IN COMBINATION WITH SALINE NOSE DROPS  Squeeze the air out of the bulb before suctioning the saline mixture.  While still squeezing the bulb flat, place the tip of the bulb into the saline mixture.  Let air come back into the bulb.  This will suction up the saline mixture.  Gently flush one nostril at a time.  Salt water nose drops will then moisten your  congested nose and loosen secretions before suctioning.  Use the bulb syringe as directed below to suction.  USING THE BULB SYRINGE TO SUCTION  While still squeezing the bulb flat, place the tip of the bulb into a nostril. Let air come back into the bulb. The suction will pull snot out of the nose and into the  bulb.  Repeat on the other nostril.  Squeeze syringe several times into a tissue.  CLEANING THE BULB SYRINGE  Clean the bulb syringe every day with hot soapy water.  Clean the inside of the bulb by squeezing the bulb while the tip is in soapy water.  Rinse by squeezing the bulb while the tip is in clean hot water.  Store the bulb with the tip side down on paper towel.  HOME CARE INSTRUCTIONS   Use saline nose drops often to keep the nose open and not stuffy.  Throw away used salt water. Make a new solution every time.  Do not use the same solution and dropper for another person  If you do not prefer to use nasal saline flush, other options include nasal saline spray or the [employer], both of which are available over the counter at your pharmacy.

## 2013-06-01 ENCOUNTER — Telehealth: Payer: Self-pay | Admitting: Medical

## 2013-06-01 NOTE — Telephone Encounter (Signed)
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  Unfortunately Chantix is a restricted medication on the list of things DOT doesn't allow.  I would recommend we not use the medication at this time.  If there will be a period of time when he will be out of work for a week or 2, we can restart it for short period of time.    As an alternative, adding Wellbutrin to his current medication may very well help reduce cravings for tobacco.  I can have him try this if agreeable.  I would like him to begin 1-800-QUIT-NOW counseling . I also would be ok with him using nicotine patches simultaneously with Wellbutrin and Risperidone if agreeable.   See if he would like to try this regimen?

## 2013-06-06 NOTE — Telephone Encounter (Signed)
Patient is aware of Marcus CoveyShane Powell [facility]AC message about the DOT and not being able to use Chantix. He was made aware of this. I went over the message in detail. CLS

## 2013-06-15 NOTE — Telephone Encounter (Signed)
Done  tsd

## 2013-06-19 ENCOUNTER — Emergency Department (HOSPITAL_COMMUNITY)
Admission: EM | Admit: 2013-06-19 | Discharge: 2013-06-19 | Disposition: A | Discharge status: Home / Self Care | Payer: BC Managed Care – PPO | Attending: Emergency Medicine | Admitting: Emergency Medicine

## 2013-06-19 ENCOUNTER — Encounter (HOSPITAL_COMMUNITY): Payer: Self-pay | Admitting: Emergency Medicine

## 2013-06-19 ENCOUNTER — Emergency Department (HOSPITAL_COMMUNITY): Payer: BC Managed Care – PPO

## 2013-06-19 DIAGNOSIS — F429 Obsessive-compulsive disorder, unspecified: Secondary | ICD-10-CM | POA: Insufficient documentation

## 2013-06-19 DIAGNOSIS — R0602 Shortness of breath: Secondary | ICD-10-CM

## 2013-06-19 DIAGNOSIS — R42 Dizziness and giddiness: Secondary | ICD-10-CM

## 2013-06-19 DIAGNOSIS — F319 Bipolar disorder, unspecified: Secondary | ICD-10-CM | POA: Insufficient documentation

## 2013-06-19 DIAGNOSIS — R05 Cough: Secondary | ICD-10-CM | POA: Insufficient documentation

## 2013-06-19 DIAGNOSIS — F17200 Nicotine dependence, unspecified, uncomplicated: Secondary | ICD-10-CM | POA: Insufficient documentation

## 2013-06-19 DIAGNOSIS — H7290 Unspecified perforation of tympanic membrane, unspecified ear: Secondary | ICD-10-CM | POA: Insufficient documentation

## 2013-06-19 DIAGNOSIS — Z79899 Other long term (current) drug therapy: Secondary | ICD-10-CM | POA: Insufficient documentation

## 2013-06-19 DIAGNOSIS — K219 Gastro-esophageal reflux disease without esophagitis: Secondary | ICD-10-CM | POA: Insufficient documentation

## 2013-06-19 DIAGNOSIS — R059 Cough, unspecified: Secondary | ICD-10-CM | POA: Insufficient documentation

## 2013-06-19 LAB — CBC WITH DIFFERENTIAL/PLATELET
Basophils Absolute: 0 10*3/uL (ref 0.0–0.1)
Basophils Relative: 0 % (ref 0–1)
Eosinophils Absolute: 0.5 10*3/uL (ref 0.0–0.7)
Eosinophils Relative: 5 % (ref 0–5)
HCT: 42 % (ref 39.0–52.0)
HEMOGLOBIN: 14.2 g/dL (ref 13.0–17.0)
Lymphocytes Relative: 26 % (ref 12–46)
Lymphs Abs: 2.6 10*3/uL (ref 0.7–4.0)
MCH: 31.8 pg (ref 26.0–34.0)
MCHC: 33.8 g/dL (ref 30.0–36.0)
MCV: 94 fL (ref 78.0–100.0)
MONOS PCT: 9 % (ref 3–12)
Monocytes Absolute: 0.9 10*3/uL (ref 0.1–1.0)
NEUTROS ABS: 6 10*3/uL (ref 1.7–7.7)
Neutrophils Relative %: 60 % (ref 43–77)
Platelets: 267 10*3/uL (ref 150–400)
RBC: 4.47 MIL/uL (ref 4.22–5.81)
RDW: 12.6 % (ref 11.5–15.5)
WBC: 10 10*3/uL (ref 4.0–10.5)

## 2013-06-19 LAB — COMPREHENSIVE METABOLIC PANEL
ALK PHOS: 29 U/L — AB (ref 39–117)
ALT: 48 U/L (ref 0–53)
AST: 22 U/L (ref 0–37)
Albumin: 3.9 g/dL (ref 3.5–5.2)
BUN: 17 mg/dL (ref 6–23)
CHLORIDE: 101 meq/L (ref 96–112)
CO2: 24 mEq/L (ref 19–32)
Calcium: 9.8 mg/dL (ref 8.4–10.5)
Creatinine, Ser: 0.93 mg/dL (ref 0.50–1.35)
GFR calc Af Amer: >90 mL/min (ref >90)
GFR calc non Af Amer: >90 mL/min (ref >90)
Glucose, Bld: 108 mg/dL — ABNORMAL HIGH (ref 70–99)
POTASSIUM: 3.8 meq/L (ref 3.7–5.3)
Sodium: 140 mEq/L (ref 137–147)
Total Protein: 7.4 g/dL (ref 6.0–8.3)

## 2013-06-19 LAB — URINALYSIS, ROUTINE W REFLEX MICROSCOPIC
BILIRUBIN URINE: NEGATIVE
GLUCOSE, UA: NEGATIVE mg/dL
Hgb urine dipstick: NEGATIVE
KETONES UR: NEGATIVE mg/dL
Leukocytes, UA: NEGATIVE
Nitrite: NEGATIVE
PROTEIN: NEGATIVE mg/dL
Specific Gravity, Urine: 1.012 (ref 1.005–1.030)
Urobilinogen, UA: 0.2 mg/dL (ref 0.0–1.0)
pH: 6.5 (ref 5.0–8.0)

## 2013-06-19 MED ORDER — SODIUM CHLORIDE 0.9 % IV BOLUS (SEPSIS)
1000.0000 mL | Freq: Once | INTRAVENOUS | Status: AC
  Administered 2013-06-19: 1000 mL via INTRAVENOUS

## 2013-06-19 NOTE — Discharge Instructions (Signed)
Follow up with your doctor in 2 days for reevaluation of symptoms. Return to Emergency department if dizziness reoccurs, or you develop any symptoms of Chest pain, shortness of breath, or the feeling that you may pass out.    Dizziness  Dizziness means you feel unsteady or lightheaded. You might feel like you are going to pass out (faint). HOME CARE   Drink enough fluids to keep your pee (urine) clear or pale yellow.  Take your medicines exactly as told by your doctor. If you take blood pressure medicine, always stand up slowly from the lying or sitting position. Hold on to something to steady yourself.  If you need to stand in one place for a long time, move your legs often. Tighten and relax your leg muscles.  Have someone stay with you until you feel okay.  Do not drive or use heavy machinery if you feel dizzy.  Do not drink alcohol. GET HELP RIGHT AWAY IF:   You feel dizzy or lightheaded and it gets worse.  You feel sick to your stomach (nauseous), or you throw up (vomit).  You have trouble talking or walking.  You feel weak or have trouble using your arms, hands, or legs.  You cannot think clearly or have trouble forming sentences.  You have chest pain, belly (abdominal) pain, sweating, or you are short of breath.  Your vision changes.  You are bleeding.  You have problems from your medicine that seem to be getting worse. MAKE SURE YOU:   Understand these instructions.  Will watch your condition.  Will get help right away if you are not doing well or get worse. Document Released: 02/05/2011 Document Revised: 05/11/2011 Document Reviewed: 02/05/2011 [facility] Patient Information 2014 [place], [place].

## 2013-06-19 NOTE — ED Notes (Addendum)
Pt has been dizzy for about 1-1.5 hours and increases with position changes and EMS reports orthostatic changes.. PT states on way to work started feeling light headed and sob.  No sickness.  152/84,initial, 138 palp standing.  SR.  [id number].  Pt denies any pain..  Pt states he felt light headed last nite, woke up and felt normal.  Denies any chest pain.  Reports long travel 2 weeks ago with 4-5 hour drive to and from, pt drives a bus daily for [employer]

## 2013-06-19 NOTE — ED Notes (Signed)
Patient transported to X-ray 

## 2013-06-19 NOTE — ED Provider Notes (Signed)
CSN: [id number]     Arrival date & time 06/19/13  [id number] History   First MD Initiated Contact with Patient 06/19/13 0544     Chief Complaint  Patient presents with  . Dizziness  . Shortness of Breath     (Consider location/radiation/quality/duration/timing/severity/associated sxs/prior Treatment) HPI 48 yo male presents with Dizziness that started this morning at 4am this morning on his way to work. 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 Patient admits to similar episodes in the past. He was told on previous occasions that his sxs were do to the "hypoglycemia". Patient describes acute onset dizziness. Patient describes a near syncopal event "everything got a little blurry". Episode lasted about 30 minutes. Dizziness is worse with standing. Patient also admits to some shortness of breath that occurred around 4:30am while he was walking up stairs "ten steps". This is patient's typcial routine and denies prior episodes of shorteness of breath. SOB resolved once sitting down. Patient denies any HA, CP, N/V, Diaphoresis, or palpitations. Patient denies any new medicaitions. Patient admits to bilateral ankle swelling the other night. Denies any hx of aneurysm or heart conditions.  Well's Criteria: Suspect DVT : (3) No PE most likely: (3) No HR > 100: (1.5) No Immobilization > 3 days or Surgery w/in 4 wks: (1.5) No Hx of DVT/PE: (1.5) No Hemoptysis: (1) No Hx of Cancer with tx w/in 6 mo: (1) No Total: 0  PERC Criteria: Age > 46 yo: No HR > 100 bpm: No O2 sat on RA < 95%: No Prior hx of DVT/PE:No Trauma or surgery in past 4 wks:No Hemoptysis:No Exogenous Estrogen/Testosterone use:No Unilateral Leg swelling: No  CAD Risk Factors: HTN: No Hyperlipidemia: No Cigarette smoking: Yes Diabetes Mellitus: No Family hx of CAD or MI < 46 yo : No Cocaine Use: No    Past Medical History  Diagnosis Date  . GERD (gastroesophageal reflux disease)   . Bipolar disorder     hx/o  hospitalization - [facility]  . OCD (obsessive compulsive disorder)    History reviewed. No pertinent past surgical history. No family history on file. History  Substance Use Topics  . Smoking status: Current Every Day Smoker    Last Attempt to Quit: 02/04/2008  . Smokeless tobacco: Not on file  . Alcohol Use: No    Review of Systems  Respiratory: Positive for cough (Chronic ). Negative for chest tightness and wheezing.   Gastrointestinal: Negative for abdominal pain.  All other systems reviewed and are negative.     Allergies  Review of patient's allergies indicates no known allergies.  Home Medications   Prior to Admission medications   Medication Sig Start Date End Date Taking? Authorizing Provider  Beclomethasone Dipropionate (QNASL) 80 MCG/ACT AERS Place 2 sprays into the nose 2 (two) times daily. 05/26/13   Kermit Baloavid S Tysinger, PA-C  omeprazole (PRILOSEC) 40 MG capsule Take 1 capsule (40 mg total) by mouth daily. 05/26/13   Kermit Baloavid S Tysinger, PA-C  risperiDONE (RISPERDAL) 0.5 MG tablet Take 1 tablet (0.5 mg total) by mouth 2 (two) times daily. 05/26/13   Kermit Baloavid S Tysinger, PA-C  risperiDONE (RISPERDAL) 0.5 MG tablet Take 1 tablet (0.5 mg total) by mouth 2 (two) times daily. 05/26/13   Kermit Baloavid S Tysinger, PA-C  varenicline (CHANTIX STARTING MONTH PAK) 0.5 MG X 11 & 1 MG X 42 tablet Take one 0.5 mg tablet by mouth once daily for 3 days, then increase to one 0.5 mg tablet twice daily for 4 days, then  increase to one 1 mg tablet twice daily. 05/26/13   Kermit Balo Tysinger, PA-C   BP 119/84  Pulse 73  Temp(Src) 97.4 F (36.3 C) (Oral)  Resp 20  Ht 6\' 3"  (1.905 m)  Wt 185 lb (83.915 kg)  BMI 23.12 kg/m2  SpO2 96% Physical Exam  Nursing note and vitals reviewed. Constitutional: He is oriented to person, place, and time. He appears well-developed and well-nourished. No distress.  HENT:  Head: Normocephalic and atraumatic.  Right Ear: Ear canal normal. A middle ear effusion is present.   Left Ear: Ear canal normal. A middle ear effusion is present.  Nose: Nose normal. Right sinus exhibits no maxillary sinus tenderness and no frontal sinus tenderness. Left sinus exhibits no maxillary sinus tenderness and no frontal sinus tenderness.  Mouth/Throat: Uvula is midline, oropharynx is clear and moist and mucous membranes are normal. No oropharyngeal exudate, posterior oropharyngeal edema or posterior oropharyngeal erythema.  Eyes: Conjunctivae and EOM are normal. Pupils are equal, round, and reactive to light. Right eye exhibits no discharge. Left eye exhibits no discharge. No scleral icterus.  Neck: Trachea normal, normal range of motion and phonation normal. Neck supple. No JVD present. Carotid bruit is not present. No rigidity. No tracheal deviation, no edema and no erythema present.  Cardiovascular: Normal rate, regular rhythm and intact distal pulses.  Exam reveals no gallop and no friction rub.   No murmur heard. Pulmonary/Chest: Effort normal and breath sounds normal. No stridor. No respiratory distress. He has no wheezes. He has no rhonchi. He has no rales.  Coarse breath sounds in lower right lobe   Abdominal: Soft. Bowel sounds are normal. He exhibits no distension. There is no tenderness.  Musculoskeletal: Normal range of motion. He exhibits no edema.  Lymphadenopathy:    He has no cervical adenopathy.  Neurological: He is alert and oriented to person, place, and time. He has normal strength. No cranial nerve deficit or sensory deficit.  Reflex Scores:      Bicep reflexes are 2+ on the right side and 2+ on the left side.      Patellar reflexes are 2+ on the right side and 2+ on the left side. CN II-XII grossly intact. Cerebellar function appears intact with finger to nose.   Skin: Skin is warm and dry. He is not diaphoretic.  Psychiatric: He has a normal mood and affect. His behavior is normal.    ED Course  Procedures (including critical care time) Labs Review Labs  Reviewed  COMPREHENSIVE METABOLIC PANEL - Abnormal; Notable for the following:    Glucose, Bld 108 (*)    Alkaline Phosphatase 29 (*)    Total Bilirubin <0.2 (*)    All other components within normal limits  CBC WITH DIFFERENTIAL  URINALYSIS, ROUTINE W REFLEX MICROSCOPIC    Imaging Review No results found.   EKG Interpretation None      MDM   Final diagnoses:  Dizziness  Shortness of breath    Patient afebrile.  Orthostatic BP WNL. HR noted to increase by > 20 bpm from sitting to standing.  CBC wnl. No evidence of anemia.  CMP wnl . No evidence of hypoglycemia.  Patient PERC negative. PE unlikely.  Patient has minimal risk factors for CAD, and denies any CP. Doubt ACS.  No hx or evidence of Heart failure on exam. Doubt CHF.  EKG wnl. No evidence of arrhythmias.  CXR negative.   6-15am - Plan to give patient fluids while he waits on lab  work. Will reassess after labs return .   8:00 am - patient reevaluated after IV fluids and snacks. Patient states he feels much better with no further episodes of dizziness. Plan to have patient follow up with his PCP in 2 days for further evaluation. Return precautions given should patient dizziness reoccur or develop chest pain or shortness of breath. Patient agrees with plan. Discharged in good condition.   Meds given in ED:  Medications  sodium chloride 0.9 % bolus 1,000 mL (0 mLs Intravenous Stopped 06/19/13 0755)  sodium chloride 0.9 % bolus 1,000 mL (1,000 mLs Intravenous New Bag/Given 06/19/13 0756)    New Prescriptions   No medications on file     Rudene AndaJacob Gray Kanyia Heaslip, PA-C 06/20/13 1600

## 2013-06-27 NOTE — ED Provider Notes (Signed)
Medical screening examination/treatment/procedure(s) were conducted as a shared visit with non-physician practitioner(s) and myself.  I personally evaluated the patient during the encounter.   EKG Interpretation None        Loren Raceravid Kealey Kemmer, MD 06/27/13 [phone]

## 2013-07-07 ENCOUNTER — Other Ambulatory Visit: Payer: Self-pay | Admitting: Medical

## 2013-07-07 NOTE — Telephone Encounter (Signed)
Is this okay to refill? 

## 2013-08-14 ENCOUNTER — Encounter (HOSPITAL_COMMUNITY): Payer: Self-pay | Admitting: Emergency Medicine

## 2013-08-14 ENCOUNTER — Emergency Department (HOSPITAL_COMMUNITY)
Admission: EM | Admit: 2013-08-14 | Discharge: 2013-08-14 | Disposition: A | Discharge status: Home / Self Care | Payer: BC Managed Care – PPO | Source: Home / Self Care

## 2013-08-14 DIAGNOSIS — T148XXA Other injury of unspecified body region, initial encounter: Secondary | ICD-10-CM

## 2013-08-14 DIAGNOSIS — IMO0002 Reserved for concepts with insufficient information to code with codable children: Secondary | ICD-10-CM

## 2013-08-14 DIAGNOSIS — M25511 Pain in right shoulder: Secondary | ICD-10-CM

## 2013-08-14 DIAGNOSIS — M25519 Pain in unspecified shoulder: Secondary | ICD-10-CM

## 2013-08-14 DIAGNOSIS — S46911A Strain of unspecified muscle, fascia and tendon at shoulder and upper arm level, right arm, initial encounter: Secondary | ICD-10-CM

## 2013-08-14 DIAGNOSIS — X58XXXA Exposure to other specified factors, initial encounter: Secondary | ICD-10-CM

## 2013-08-14 DIAGNOSIS — X503XXA Overexertion from repetitive movements, initial encounter: Secondary | ICD-10-CM

## 2013-08-14 MED ORDER — HYDROCODONE-ACETAMINOPHEN 5-325 MG PO TABS: ORAL_TABLET | ORAL | Status: DC

## 2013-08-14 MED ORDER — KETOROLAC TROMETHAMINE 60 MG/2ML IM SOLN
INTRAMUSCULAR | Status: AC
  Filled 2013-08-14: qty 2

## 2013-08-14 MED ORDER — MELOXICAM 15 MG PO TABS: 15.0000 mg | ORAL_TABLET | Freq: Every day | ORAL | Status: DC

## 2013-08-14 MED ORDER — KETOROLAC TROMETHAMINE 60 MG/2ML IM SOLN
60.0000 mg | Freq: Once | INTRAMUSCULAR | Status: AC
  Administered 2013-08-14: 60 mg via INTRAMUSCULAR

## 2013-08-14 MED ORDER — DICLOFENAC SODIUM 1 % TD GEL: 1.0000 "application " | Freq: Four times a day (QID) | TRANSDERMAL | Status: DC

## 2013-08-14 NOTE — ED Provider Notes (Signed)
CSN: [id number]     Arrival date & time 08/14/13  1253 History   First MD Initiated Contact with Patient 08/14/13 1505     Chief Complaint  Patient presents with  . Shoulder Pain   (Consider location/radiation/quality/duration/timing/severity/associated sxs/prior Treatment) HPI Comments: 48 year old male presents with a complaint of right shoulder pain for one week. The pain is located to the medial aspect of the right scapula and the right anterior chest near the anterior joint line. 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 This seems to exacerbate the pain. Denies any known event or accident that caused the pain.   Past Medical History  Diagnosis Date  . GERD (gastroesophageal reflux disease)   . Bipolar disorder     hx/o hospitalization - [facility]  . OCD (obsessive compulsive disorder)    History reviewed. No pertinent past surgical history. History reviewed. No pertinent family history. History  Substance Use Topics  . Smoking status: Current Every Day Smoker    Last Attempt to Quit: 02/04/2008  . Smokeless tobacco: Not on file  . Alcohol Use: No    Review of Systems  Constitutional: Positive for activity change. Negative for fever and fatigue.  Respiratory: Negative.   Cardiovascular: Negative.   Gastrointestinal: Negative.   Genitourinary: Negative.   Musculoskeletal:       As per HPI  Skin: Negative.   Neurological: Negative for dizziness, weakness, numbness and headaches.    Allergies  Review of patient's allergies indicates no known allergies.  Home Medications   Prior to Admission medications   Medication Sig Start Date End Date Taking? Authorizing Provider  azelastine (ASTELIN) 137 MCG/SPRAY nasal spray Place 1 spray into both nostrils 2 (two) times daily. Use in each nostril as directed    Historical Provider, MD  diclofenac sodium (VOLTAREN) 1 % GEL Apply 1 application topically 4 (four) times daily. 08/14/13   Hayden Rasmussenavid Elisha Cooksey, NP   HYDROcodone-acetaminophen (NORCO/VICODIN) 5-325 MG per tablet Take 1 tab q4h at night time prn pain. May cause drowsiness. 08/14/13   Hayden Rasmussenavid Selin Eisler, NP  meloxicam (MOBIC) 15 MG tablet Take 1 tablet (15 mg total) by mouth daily. 08/14/13   Hayden Rasmussenavid Marci Polito, NP  omeprazole (PRILOSEC) 40 MG capsule Take 40 mg by mouth daily.    Historical Provider, MD  risperiDONE (RISPERDAL) 0.5 MG tablet TAKE 1 TABLET (0.5 MG TOTAL) BY MOUTH 2 (TWO) TIMES DAILY.    Kermit Baloavid S Tysinger, PA-C   BP 121/84  Pulse 66  Temp(Src) 97.9 F (36.6 C) (Oral)  Resp 16  SpO2 99% Physical Exam  Nursing note and vitals reviewed. Constitutional: He is oriented to person, place, and time. He appears well-developed and well-nourished.  HENT:  Head: Normocephalic and atraumatic.  Eyes: EOM are normal.  Neck: Normal range of motion. Neck supple.  Pulmonary/Chest: Effort normal. No respiratory distress.  Musculoskeletal:  Full ROM of shoulder. No swelling, deformity or discoloration. Tenderness to the R rhomboid and para thoracic musculature.  Tenderness to the R anterior upper chest pectoral muscle and tendons that insert across the anterior joint line. No joint line tenderness. Distal N/V, M/s intact . Radial pulse 2+  Neurological: He is alert and oriented to person, place, and time. No cranial nerve deficit.  Skin: Skin is warm and dry.  Psychiatric: He has a normal mood and affect.    ED Course  Procedures (including critical care time) Labs Review Labs Reviewed - No data to display  Imaging Review No results  found.   MDM   1. Right anterior shoulder pain   2. 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 Muscle strain of right shoulder region   4. Muscle strain of right scapular region     Diclofenac gel qid mobic 15 mg norco 5 mg at home, not work. #15 toradol 60 mg IM Heat to back and ice to anterior shoulder. 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 F/U  PCP and get a referral to PT as needed.    Hayden Rasmussenavid Chester Romero, NP 08/14/13 [phone]

## 2013-08-14 NOTE — Discharge Instructions (Signed)
Repetitive Strain Injuries °Repetitive strain injuries (RSIs) result from overuse or misuse of soft tissues including muscles, tendons, or nerves. Tendons are the cord-like structures that attach muscles to bones. RSIs can affect almost any part of the body. However, RSIs are most common in the arms (thumbs, wrists, elbows, shoulders) and legs (ankles, knees). Common medical conditions that are often caused by repetitive strain include carpal tunnel syndrome, tennis or golfer's elbow, bursitis, and tendonitis. If RSIs are treated early, and the repeated activity is reduced or removed, the severity and length of your problems can usually be reduced. RSIs are also called cumulative trauma disorders (CTD).  °CAUSES  °Many RSIs occur due to repeating the same activity at work over weeks or months without sufficient rest, such as prolonged typing. RSIs also commonly occur when a hobby or sport is done repeatedly without sufficient rest. RSIs can also occur due to repeated strain or stress on a body part in someone who has one or more risk factors for RSIs. °RISK FACTORS °Workplace risk factors °· Frequent computer use, especially if your workstation is not adjusted for your body type. °· Infrequent rest breaks. °· Working in a high-pressure environment. °· Working at a fast pace. °· Repeating the same motion, such as frequent typing. °· Working in an awkward position or holding the same position for a long time. °· Forceful movements such as lifting, pulling, or pushing. °· Vibration caused by using power tools. °· Working in cold temperatures. °· Job stress. °Personal risk factors °· Poor posture. °· Being loose-jointed. °· Not exercising regularly. °· Being overweight. °· Arthritis, diabetes, thyroid problems, or other long-term (chronic) medical conditions. °· Vitamin deficiencies. °· Keeping your fingernails long. °· An unhealthy, stressful, or inactive lifestyle. °· Not sleeping well. °SYMPTOMS  °Symptoms often  begin at work but become more noticeable after the repeated stress has ended. For example, you may develop fatigue or soreness in your  wrist while typing at work, and at night you may develop numbness and tingling in your fingers. Common symptoms include:  °· Burning, shooting, or aching pain, especially in the fingers, palms, wrists, forearms, or shoulders. °· Tenderness. °· Swelling. °· Tingling, numbness, or loss of feeling. °· Pain with certain activities, such as turning a doorknob or reaching above your head. °· Weakness, heaviness, or loss of coordination in your hand. °· Muscle spasms or tightness. °In some cases, symptoms can become so intense that it is difficult to perform everyday tasks. Symptoms that do not improve with rest may indicate a more serious condition.  °DIAGNOSIS  °Your caregiver may determine the type of RSI you have based on your medical evaluation and a description of your activities.  °TREATMENT  °Treatment depends on the severity and type of RSI you have. Your caregiver may recommend rest for the affected body part, medicines, and physical or occupational therapy to reduce pain, swelling, and soreness. Discuss the activities you do repeatedly with your caregiver. Your caregiver can help you decide whether you need to change your activities. An RSI may take months or years to heal, especially if the affected body part gets insufficient rest. In some cases, such as severe carpal tunnel syndrome, surgery may be recommended. °PREVENTION °· Talk with your supervisor to make sure you have the proper equipment for your work station. °· Maintain good posture at your desk or work station with: °· Feet flat on the floor. °· Knees directly over the feet, bent at a right angle. °· Lower back supported by your chair or a   cushion in the curve of your lower back.  Shoulders and arms relaxed and at your sides.  Neck relaxed and not bent forwards or backwards.  Your desk and computer workstation  properly adjusted to your body type.  Your chair adjusted so there is no excess pressure on the back of your thighs.  The keyboard resting above your thighs. You should be able to reach the keys with your elbows at your side, bent at a right angle. Your arms should be supported on forearm rests, with your forearms parallel to the ground.  The computer mouse within easy reach.  The monitor directly in front of you, so that your eyes are aligned with the top of the screen. The screen should be about 15 to 25 inches from your eyes.  While typing, keep your wrist straight, in a neutral position. Move your entire arm when you move your mouse or when typing hard-to-reach keys.  Only use your computer as much as you need to for work. Do not use it during breaks.  Take breaks often from any repeated activity. Alternate with another task which requires you to use different muscles, or rest at least once every hour.  Change positions regularly. If you spend a lot of time sitting, get up, walk around, and stretch.  Do not hold pens or pencils tightly when writing.  Exercise regularly.  Maintain a normal weight.  Eat a diet with plenty of vegetables, whole grains, and fruit.  Get sufficient, restful sleep. HOME CARE INSTRUCTIONS  If your caregiver prescribed medicine to help reduce swelling, take it as directed.  Only take over-the-counter or prescription medicines for pain, discomfort, or fever as directed by your caregiver.  Reduce, and if needed, stopthe activities that are causing your problems until you have no further symptoms.If your symptoms are work-related, you may need to talk to your supervisor about changing your activities.  When symptoms develop, put ice or a cold pack on the aching area.  Put ice in a plastic bag.  Place a towel between your skin and the bag.  Leave the ice on for 15-20 minutes.  If you were given a splint to keep your wrist from bending, wear it as  instructed. It is important to wear the splint at night. Use the splint for as long as your caregiver recommends. SEEK MEDICAL CARE IF:  You develop new problems.  Your problems do not get better with medicine. MAKE SURE YOU:  Understand these instructions.  Will watch your condition.  Will get help right away if you are not doing well or get worse. Document Released: 02/06/2002 Document Revised: 08/18/2011 Document Reviewed: 04/09/2011 [facility] Patient Information 2014 [place], [place].  Shoulder Pain Your problem does not appear to be in the joint, but surrounding it. The shoulder is the joint that connects your arms to your body. The bones that form the shoulder joint include the upper arm bone (humerus), the shoulder blade (scapula), and the collarbone (clavicle). The top of the humerus is shaped like a ball and fits into a rather flat socket on the scapula (glenoid cavity). A combination of muscles and strong, fibrous tissues that connect muscles to bones (tendons) support your shoulder joint and hold the ball in the socket. Small, fluid-filled sacs (bursae) are located in different areas of the joint. They act as cushions between the bones and the overlying soft tissues and help reduce friction between the gliding tendons and the bone as you move your arm. Your shoulder  joint allows a wide range of motion in your arm. This range of motion allows you to do things like scratch your back or throw a ball. However, this range of motion also makes your shoulder more prone to pain from overuse and injury. Causes of shoulder pain can originate from both injury and overuse and usually can be grouped in the following four categories:  Redness, swelling, and pain (inflammation) of the tendon (tendinitis) or the bursae (bursitis).  Instability, such as a dislocation of the joint.  Inflammation of the joint (arthritis).  Broken bone (fracture). HOME CARE INSTRUCTIONS   Apply ice to the sore  area.  Put ice in a plastic bag.  Place a towel between your skin and the bag.  Leave the ice on for 15-20 minutes, 03-04 times per day for the first 2 days.  Stop using cold packs if they do not help with the pain.  If you have a shoulder sling or immobilizer, wear it as long as your caregiver instructs. Only remove it to shower or bathe. Move your arm as little as possible, but keep your hand moving to prevent swelling.  Squeeze a soft ball or foam pad as much as possible to help prevent swelling.  Only take over-the-counter or prescription medicines for pain, discomfort, or fever as directed by your caregiver. SEEK MEDICAL CARE IF:   Your shoulder pain increases, or new pain develops in your arm, hand, or fingers.  Your hand or fingers become cold and numb.  Your pain is not relieved with medicines. SEEK IMMEDIATE MEDICAL CARE IF:   Your arm, hand, or fingers are numb or tingling.  Your arm, hand, or fingers are significantly swollen or turn white or blue. MAKE SURE YOU:   Understand these instructions.  Will watch your condition.  Will get help right away if you are not doing well or get worse. Document Released: 11/26/2004 Document Revised: 11/11/2011 Document Reviewed: 01/31/2011 [facility]Exit[facility] Patient Information 2014 [place] SpringsExitCare, MarylandLL[place].

## 2013-08-14 NOTE — ED Notes (Signed)
C/o right shoulder pain for a week now Denies any injury States has been coughing a lot lately which is slightly productive

## 2013-08-15 ENCOUNTER — Telehealth: Payer: Self-pay | Admitting: Family Medicine

## 2013-08-15 NOTE — ED Provider Notes (Signed)
Medical screening examination/treatment/procedure(s) were performed by resident physician or non-physician practitioner and as supervising physician I was immediately available for consultation/collaboration.   KINDL,JAMES DOUGLAS MD.   James D Kindl, MD 08/15/13 1003 

## 2013-08-15 NOTE — Telephone Encounter (Signed)
ER letter sent 

## 2013-08-17 NOTE — Telephone Encounter (Signed)
dt ?

## 2013-10-12 ENCOUNTER — Other Ambulatory Visit: Payer: Self-pay | Admitting: Medical

## 2014-06-12 ENCOUNTER — Encounter (HOSPITAL_COMMUNITY): Payer: Self-pay | Admitting: Emergency Medicine

## 2014-06-12 ENCOUNTER — Emergency Department (HOSPITAL_COMMUNITY)
Admission: EM | Admit: 2014-06-12 | Discharge: 2014-06-12 | Disposition: A | Discharge status: Home / Self Care | Payer: BLUE CROSS/BLUE SHIELD | Source: Home / Self Care | Attending: Family Medicine | Admitting: Family Medicine

## 2014-06-12 DIAGNOSIS — M25512 Pain in left shoulder: Secondary | ICD-10-CM

## 2014-06-12 MED ORDER — CYCLOBENZAPRINE HCL 10 MG PO TABS: 10.0000 mg | ORAL_TABLET | Freq: Two times a day (BID) | ORAL | Status: DC | PRN

## 2014-06-12 MED ORDER — MELOXICAM 15 MG PO TABS: 15.0000 mg | ORAL_TABLET | Freq: Every day | ORAL | Status: DC

## 2014-06-12 NOTE — ED Notes (Signed)
Pt has c/o numbness and tingling intermittently in his left upper arm that has been going on since Friday.  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  The arm becomes heavy and it gets numb and he experiences some SOB when the pain increases.

## 2014-06-12 NOTE — Discharge Instructions (Signed)
Musculoskeletal Pain °Musculoskeletal pain is muscle and boney aches and pains. These pains can occur in any part of the body. Your caregiver may treat you without knowing the cause of the pain. They may treat you if blood or urine tests, X-rays, and other tests were normal.  °CAUSES °There is often not a definite cause or reason for these pains. These pains may be caused by a type of germ (virus). The discomfort may also come from overuse. Overuse includes working out too hard when your body is not fit. Boney aches also come from weather changes. Bone is sensitive to atmospheric pressure changes. °HOME CARE INSTRUCTIONS  °· Ask when your test results will be ready. Make sure you get your test results. °· Only take over-the-counter or prescription medicines for pain, discomfort, or fever as directed by your caregiver. If you were given medications for your condition, do not drive, operate machinery or power tools, or sign legal documents for 24 hours. Do not drink alcohol. Do not take sleeping pills or other medications that may interfere with treatment. °· Continue all activities unless the activities cause more pain. When the pain lessens, slowly resume normal activities. Gradually increase the intensity and duration of the activities or exercise. °· During periods of severe pain, bed rest may be helpful. Lay or sit in any position that is comfortable. °· Putting ice on the injured area. °¨ Put ice in a bag. °¨ Place a towel between your skin and the bag. °¨ Leave the ice on for 15 to 20 minutes, 3 to 4 times a day. °· Follow up with your caregiver for continued problems and no reason can be found for the pain. If the pain becomes worse or does not go away, it may be necessary to repeat tests or do additional testing. Your caregiver may need to look further for a possible cause. °SEEK IMMEDIATE MEDICAL CARE IF: °· You have pain that is getting worse and is not relieved by medications. °· You develop chest pain  that is associated with shortness or breath, sweating, feeling sick to your stomach (nauseous), or throw up (vomit). °· Your pain becomes localized to the abdomen. °· You develop any new symptoms that seem different or that concern you. °MAKE SURE YOU:  °· Understand these instructions. °· Will watch your condition. °· Will get help right away if you are not doing well or get worse. °Document Released: 02/16/2005 Document Revised: 05/11/2011 Document Reviewed: 10/21/2012 °ExitCare® Patient Information ©2015 [employer] LLC. This information is not intended to replace advice given to you by your health care provider. Make sure you discuss any questions you have with your health care provider. °Shoulder Pain °The shoulder is the joint that connects your arms to your body. The bones that form the shoulder joint include the upper arm bone (humerus), the shoulder blade (scapula), and the collarbone (clavicle). The top of the humerus is shaped like a ball and fits into a rather flat socket on the scapula (glenoid cavity). A combination of muscles and strong, fibrous tissues that connect muscles to bones (tendons) support your shoulder joint and hold the ball in the socket. Small, fluid-filled sacs (bursae) are located in different areas of the joint. They act as cushions between the bones and the overlying soft tissues and help reduce friction between the gliding tendons and the bone as you move your arm. Your shoulder joint allows a wide range of motion in your arm. This range of motion allows you to do things   like scratch your back or throw a ball. However, this range of motion also makes your shoulder more prone to pain from overuse and injury. °Causes of shoulder pain can originate from both injury and overuse and usually can be grouped in the following four categories: °· Redness, swelling, and pain (inflammation) of the tendon (tendinitis) or the bursae (bursitis). °· Instability, such as a dislocation of the  joint. °· Inflammation of the joint (arthritis). °· Broken bone (fracture). °HOME CARE INSTRUCTIONS  °· Apply ice to the sore area. °¨ Put ice in a plastic bag. °¨ Place a towel between your skin and the bag. °¨ Leave the ice on for 15-20 minutes, 3-4 times per day for the first 2 days, or as directed by your health care provider. °· Stop using cold packs if they do not help with the pain. °· If you have a shoulder sling or immobilizer, wear it as long as your caregiver instructs. Only remove it to shower or bathe. Move your arm as little as possible, but keep your hand moving to prevent swelling. °· Squeeze a soft ball or foam pad as much as possible to help prevent swelling. °· Only take over-the-counter or prescription medicines for pain, discomfort, or fever as directed by your caregiver. °SEEK MEDICAL CARE IF:  °· Your shoulder pain increases, or new pain develops in your arm, hand, or fingers. °· Your hand or fingers become cold and numb. °· Your pain is not relieved with medicines. °SEEK IMMEDIATE MEDICAL CARE IF:  °· Your arm, hand, or fingers are numb or tingling. °· Your arm, hand, or fingers are significantly swollen or turn white or blue. °MAKE SURE YOU:  °· Understand these instructions. °· Will watch your condition. °· Will get help right away if you are not doing well or get worse. °Document Released: 11/26/2004 Document Revised: 07/03/2013 Document Reviewed: 01/31/2011 °ExitCare® Patient Information ©2015 [employer] LLC. This information is not intended to replace advice given to you by your health care provider. Make sure you discuss any questions you have with your health care provider. ° °

## 2014-06-12 NOTE — ED Provider Notes (Signed)
CSN: [id number]     Arrival date & time 06/12/14  1334 History   First MD Initiated Contact with Patient 06/12/14 1412     Chief Complaint  Patient presents with  . 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 This started on Friday, 4 days ago. He has some stiffness and some tingling and numbness in the left upper and lateral shoulder. 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 He has never had this before and he denies any injury. He feels slightly short of breath when the pain comes on, that just started today. He denies any chest pain or NVD. Pain is not exertional. He denies any injury. No swelling of the upper or lower extremities  Past Medical History  Diagnosis Date  . GERD (gastroesophageal reflux disease)   . Bipolar disorder     hx/o hospitalization - [facility]  . OCD (obsessive compulsive disorder)    History reviewed. No pertinent past surgical history. History reviewed. No pertinent family history. History  Substance Use Topics  . Smoking status: Current Every Day Smoker    Last Attempt to Quit: 02/04/2008  . Smokeless tobacco: Not on file  . Alcohol Use: No    Review of Systems  Respiratory: Positive for shortness of breath.   Musculoskeletal: Positive for myalgias and arthralgias.  All other systems reviewed and are negative.   Allergies  Review of patient's allergies indicates no known allergies.  Home Medications   Prior to Admission medications   Medication Sig Start Date End Date Taking? Authorizing Provider  loratadine (CLARITIN) 10 MG tablet Take 10 mg by mouth daily.   Yes Historical Provider, MD  ranitidine (ZANTAC) 150 MG tablet Take 150 mg by mouth 2 (two) times daily.   Yes Historical Provider, MD  azelastine (ASTELIN) 137 MCG/SPRAY nasal spray Place 1  spray into both nostrils 2 (two) times daily. Use in each nostril as directed    Historical Provider, MD  cyclobenzaprine (FLEXERIL) 10 MG tablet Take 1 tablet (10 mg total) by mouth 2 (two) times daily as needed for muscle spasms. 06/12/14   Graylon GoodZachary H Ascension Stfleur, PA-C  diclofenac sodium (VOLTAREN) 1 % GEL Apply 1 application topically 4 (four) times daily. 08/14/13   Hayden Rasmussenavid Mabe, NP  HYDROcodone-acetaminophen (NORCO/VICODIN) 5-325 MG per tablet Take 1 tab q4h at night time prn pain. May cause drowsiness. 08/14/13   Hayden Rasmussenavid Mabe, NP  meloxicam (MOBIC) 15 MG tablet Take 1 tablet (15 mg total) by mouth daily. 08/14/13   Hayden Rasmussenavid Mabe, NP  meloxicam (MOBIC) 15 MG tablet Take 1 tablet (15 mg total) by mouth daily. 06/12/14   Adrian BlackwaterZachary H Jimia Gentles, PA-C  omeprazole (PRILOSEC) 40 MG capsule TAKE ONE CAPSULE BY MOUTH EVERY DAY 10/12/13   Kermit Baloavid S Tysinger, PA-C  risperiDONE (RISPERDAL) 0.5 MG tablet TAKE 1 TABLET (0.5 MG TOTAL) BY MOUTH 2 (TWO) TIMES DAILY.    Kermit Baloavid S Tysinger, PA-C   BP 127/77 mmHg  Pulse 89  Temp(Src) 97.7 F (36.5 C) (Oral)  Resp 16  SpO2 98% Physical Exam  Constitutional: He is oriented to person, place, and time. He appears well-developed and well-nourished. No distress.  HENT:  Head: Normocephalic.  Neck: Normal range of motion. Neck supple.  Cardiovascular: Normal rate, regular rhythm, normal heart sounds and intact distal pulses.   Pulmonary/Chest:  Effort normal and breath sounds normal. No respiratory distress. He has no wheezes. He has no rales.  Musculoskeletal:       Left shoulder: He exhibits decreased range of motion (Full active and passive range of motion but mild pain in the shoulder with external rotation only,). He exhibits no tenderness, no bony tenderness, no swelling, no effusion, no crepitus, no deformity, no pain, no spasm, normal pulse and normal strength.       Left upper arm: Normal.  No peripheral edema  Lymphadenopathy:    He has no cervical adenopathy.  Neurological: He is  alert and oriented to person, place, and time. Coordination normal.  Skin: Skin is warm and dry. No rash noted. He is not diaphoretic.  Psychiatric: He has a normal mood and affect. Judgment normal.  Nursing note and vitals reviewed.   ED Course  ED EKG  Date/Time: 06/12/2014 3:31 PM Performed by: Graylon Good Authorized by: Autumn Messing H Comparison: not compared with previous ECG  Rhythm: sinus rhythm Rate: normal QRS axis: normal Conduction: conduction normal ST Segments: ST segments normal T Waves: T waves normal Other: no other findings Clinical impression: normal ECG Comments: EKG independently reviewed by me as above   (including critical care time) Labs Review Labs Reviewed - No data to display  Imaging Review No results found.   MDM   1. Left shoulder pain    EKG is normal. His exam is normal. He only has mild pain with external rotation. This is likely due to the repetitive nature of driving for long time holding the wheel. We discussed some changes that he may try to help alleviate some of the pain. We will start him on a regimen of daily meloxicam and Flexeril when necessary. Follow-up when necessary  Meds ordered this encounter  Medications  . ranitidine (ZANTAC) 150 MG tablet    Sig: Take 150 mg by mouth 2 (two) times daily.  Marland Kitchen loratadine (CLARITIN) 10 MG tablet    Sig: Take 10 mg by mouth daily.  . meloxicam (MOBIC) 15 MG tablet    Sig: Take 1 tablet (15 mg total) by mouth daily.    Dispense:  30 tablet    Refill:  2  . cyclobenzaprine (FLEXERIL) 10 MG tablet    Sig: Take 1 tablet (10 mg total) by mouth 2 (two) times daily as needed for muscle spasms.    Dispense:  20 tablet    Refill:  0       Graylon Good, PA-C 06/12/14 [phone]

## 2014-07-31 ENCOUNTER — Ambulatory Visit (INDEPENDENT_AMBULATORY_CARE_PROVIDER_SITE_OTHER): Payer: BLUE CROSS/BLUE SHIELD | Admitting: Medical

## 2014-07-31 ENCOUNTER — Encounter: Payer: Self-pay | Admitting: Medical

## 2014-07-31 ENCOUNTER — Telehealth: Payer: Self-pay | Admitting: Medical

## 2014-07-31 VITALS — BP 120/78 | HR 113 | Resp 15 | Wt 174.0 lb

## 2014-07-31 DIAGNOSIS — K648 Other hemorrhoids: Secondary | ICD-10-CM | POA: Diagnosis not present

## 2014-07-31 DIAGNOSIS — K644 Residual hemorrhoidal skin tags: Secondary | ICD-10-CM

## 2014-07-31 MED ORDER — DOCUSATE SODIUM 100 MG PO CAPS: 100.0000 mg | ORAL_CAPSULE | Freq: Two times a day (BID) | ORAL | Status: AC | PRN

## 2014-07-31 MED ORDER — HYDROCORTISONE ACETATE 25 MG RE SUPP: 25.0000 mg | Freq: Two times a day (BID) | RECTAL | Status: AC

## 2014-07-31 MED ORDER — LUBIPROSTONE 24 MCG PO CAPS: 24.0000 ug | ORAL_CAPSULE | Freq: Two times a day (BID) | ORAL | Status: AC

## 2014-07-31 MED ORDER — HYDROCORTISONE 2.5 % RE CREA: 1.0000 "application " | TOPICAL_CREAM | Freq: Two times a day (BID) | RECTAL | Status: AC

## 2014-07-31 NOTE — Telephone Encounter (Signed)
Please call out generic hydrocortisone 2.5% suppository and cream, generic of each

## 2014-07-31 NOTE — Telephone Encounter (Signed)
I called the pharmacy and she said that the Hydrocortisone cream is covered under the patients insurance but the suppository's are not covered and the generic is $ 82.00.

## 2014-07-31 NOTE — Progress Notes (Signed)
Subjective: Here for hemorrhoids.  Here with girlfriend today.   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   Has had hemorrhoid problems for years.  Has BM every other day.   Has used various OTC treatments including preparation H, uses daily OTC suppositories, has used creams OTC, stool softener ,and laxative occasionally.  No prior eval or treatment by physician for this.   No recent heavy lifting.  No prior hemorrhoid procedure or surgery.  No other aggravating or relieving factors. No other complaint.  Objective: BP 120/78 mmHg  Pulse 113  Resp 15  Wt 174 lb (78.926 kg)  Gen: wdwn, nad Abdomen: +bs, soft, nontender, no organomegaly Anus with 1 large and 1 small exernal nonthrombosed hemorrhoid, no fissure, nontender to touch   Assessment: Encounter Diagnosis  Name Primary?  . External hemorrhoids Yes     Plan Recommendations:  Begin Amitiza 24mcg twice daily for constipation.  Start with the lower dose 8mcg twice daily samples, then go to the 24mcg twice daily  Use Colace stool softener as needed  Begin 3-5 days of hydrocortisone suppository and cream  Drink 64 + oz water daily  Try to get 25g of fiber daily in the diet  Recheck in 2-3 weeks.    Loraine LericheMark was seen today for hemorrhoids.  Diagnoses and all orders for this visit:  External hemorrhoids  Other orders -     hydrocortisone (ANUSOL-HC) 2.5 % rectal cream; Place 1 application rectally 2 (two) times daily. -     hydrocortisone (ANUSOL-HC) 25 MG suppository; Place 1 suppository (25 mg total) rectally 2 (two) times daily. -     lubiprostone (AMITIZA) 24 MCG capsule; Take 1 capsule (24 mcg total) by mouth 2 (two) times daily with a meal. -     docusate sodium (COLACE) 100 MG capsule; Take 1 capsule (100 mg total) by mouth 2 (two) times daily as needed for mild constipation.

## 2014-07-31 NOTE — Telephone Encounter (Signed)
As pt was leaving, he states that he received a text from  [facility] stating that Anusol suppositories are not covered by his insurance and he would need to pay $80 for that med. Pt says he can not pay that and wants to know what else he can get or do.

## 2014-07-31 NOTE — Telephone Encounter (Signed)
Call back and see if proctofoam and any other option is available that is affordable?

## 2014-07-31 NOTE — Patient Instructions (Signed)
Encounter Diagnosis  Name Primary?  . External hemorrhoids Yes    Recommendations:  Begin Amitiza twice daily for constipation.  Start with the lower dose twice daily samples, then go to the tiwce daily  Use Colace stool softener as needed  Begin 3-5 days of hydrocortisone suppository and cream  Drink 64 + oz water daily  Try to get 25g of fiber daily in the diet  Recheck in 2-3 weeks.     Hemorrhoids Hemorrhoids are swollen veins around the rectum or anus. There are two types of hemorrhoids:   Internal hemorrhoids. These occur in the veins just inside the rectum. They may poke through to the outside and become irritated and painful.  External hemorrhoids. These occur in the veins outside the anus and can be felt as a painful swelling or hard lump near the anus. CAUSES  Pregnancy.   Obesity.   Constipation or diarrhea.   Straining to have a bowel movement.   Sitting for long periods on the toilet.  Heavy lifting or other activity that caused you to strain.  Anal intercourse. SYMPTOMS   Pain.   Anal itching or irritation.   Rectal bleeding.   Fecal leakage.   Anal swelling.   One or more lumps around the anus.  DIAGNOSIS  Your caregiver may be able to diagnose hemorrhoids by visual examination. Other examinations or tests that may be performed include:   Examination of the rectal area with a gloved hand (digital rectal exam).   Examination of anal canal using a small tube (scope).   A blood test if you have lost a significant amount of blood.  A test to look inside the colon (sigmoidoscopy or colonoscopy). TREATMENT Most hemorrhoids can be treated at home. However, if symptoms do not seem to be getting better or if you have a lot of rectal bleeding, your caregiver may perform a procedure to help make the hemorrhoids get smaller or remove them completely. Possible treatments include:   Placing a rubber band at the base of  the hemorrhoid to cut off the circulation (rubber band ligation).   Injecting a chemical to shrink the hemorrhoid (sclerotherapy).   Using a tool to burn the hemorrhoid (infrared light therapy).   Surgically removing the hemorrhoid (hemorrhoidectomy).   Stapling the hemorrhoid to block blood flow to the tissue (hemorrhoid stapling).  HOME CARE INSTRUCTIONS   Eat foods with fiber, such as whole grains, beans, nuts, fruits, and vegetables. Ask your doctor about taking products with added fiber in them (fibersupplements).  Increase fluid intake. Drink enough water and fluids to keep your urine clear or pale yellow.   Exercise regularly.   Go to the bathroom when you have the urge to have a bowel movement. Do not wait.   Avoid straining to have bowel movements.   Keep the anal area dry and clean. Use wet toilet paper or moist towelettes after a bowel movement.   Medicated creams and suppositories may be used or applied as directed.   Only take over-the-counter or prescription medicines as directed by your caregiver.   Take warm sitz baths for 15-20 minutes, 3-4 times a day to ease pain and discomfort.   Place ice packs on the hemorrhoids if they are tender and swollen. Using ice packs between sitz baths may be helpful.   Put ice in a plastic bag.   Place a towel between your skin and the bag.   Leave the ice on for 15-20 minutes, 3-4  times a day.   Do not use a donut-shaped pillow or sit on the toilet for long periods. This increases blood pooling and pain.  SEEK MEDICAL CARE IF:  You have increasing pain and swelling that is not controlled by treatment or medicine.  You have uncontrolled bleeding.  You have difficulty or you are unable to have a bowel movement.  You have pain or inflammation outside the area of the hemorrhoids. MAKE SURE YOU:  Understand these instructions.  Will watch your condition.  Will get help right away if you are not doing  well or get worse. Document Released: 02/14/2000 Document Revised: 02/03/2012 Document Reviewed: 12/22/2011 [facility] Patient Information 2015 [employer] [place]. This information is not intended to replace advice given to you by your health care provider. Make sure you discuss any questions you have with your health care provider.

## 2014-08-01 ENCOUNTER — Telehealth: Payer: Self-pay | Admitting: Medical

## 2014-08-01 NOTE — Telephone Encounter (Signed)
Patient states that he pick up the Amitiza, Colace and the Hydrocortisone cream.  I explain to the patient that the pharmacists states that the cream and the suppository are the same one is cream and one is a sold. I told him we could try the cream to see if it helps and if not we will go to something else.

## 2014-08-01 NOTE — Telephone Encounter (Signed)
Please clarify:   What was copay for each:  Amitiza Colace Cream Suppository  If he is referring to cream or suppository, please just call out generic Hydrocortisone 2.5% cream and suppository same dose.     Colace is technically generic OTC  Linzess would be an option instead of Amitiza.   Let me know

## 2014-08-01 NOTE — Telephone Encounter (Signed)
LM WITH FAMILY MEMBER TO CB. CLS

## 2014-08-01 NOTE — Telephone Encounter (Signed)
Pt called and stated that the medication he was given yesterday is not covered by his insurance. Please advise. Pt can be reached at [phone] and uses [facility] [address]

## 2014-08-01 NOTE — Telephone Encounter (Signed)
I spoke with the pharmacy and they state that she can't tell me any prices unless she has the Rx sent into the office. She states that he has the cream which is about the same as the suppository's. I went over this with Kristian CoveyShane Tysinger PA and he states we will just go with the cream for now.   I ask for the price of the proctofoam and the pharmacists states that she can only give the cash price which is $300.00.

## 2014-08-16 ENCOUNTER — Ambulatory Visit: Payer: Self-pay | Admitting: Family Medicine

## 2014-09-04 ENCOUNTER — Telehealth: Payer: Self-pay | Admitting: Medical

## 2014-09-04 NOTE — Telephone Encounter (Signed)
Patient states no they never went away. When he said that it got better, he meant that they stop bothering him. He said that he did everything you advised him to do but now they are bothering him again, they are still hanging out and it feels like a tear.

## 2014-09-04 NOTE — Telephone Encounter (Signed)
Please call re hemorrhoid issues, wants referral to specialist  Thinks he may have a tear, got better but having problems again

## 2014-09-04 NOTE — Telephone Encounter (Signed)
Does he still have the big hemorrhoids hanging out?  What do you mean by got better, did they go away completely?  May need recheck exam here, but let me know .  If the hemorrhoid tissue hanging out never got better or went down, may need to see surgeon, but let me know the reply

## 2014-09-05 ENCOUNTER — Other Ambulatory Visit: Payer: Self-pay | Admitting: Medical

## 2014-09-05 ENCOUNTER — Telehealth: Payer: Self-pay | Admitting: Family Medicine

## 2014-09-05 ENCOUNTER — Telehealth: Payer: Self-pay | Admitting: Medical

## 2014-09-05 MED ORDER — TRAMADOL HCL 50 MG PO TABS: 50.0000 mg | ORAL_TABLET | Freq: Four times a day (QID) | ORAL | Status: AC | PRN

## 2014-09-05 NOTE — Telephone Encounter (Signed)
[facility] until [facility]. Patient states that

## 2014-09-05 NOTE — Telephone Encounter (Signed)
Refer to general surgery for hemorrhoids

## 2014-09-05 NOTE — Telephone Encounter (Signed)
Pt requesting pain med to help with pain until he gets in with surgeon

## 2014-09-05 NOTE — Telephone Encounter (Signed)
I FAX OVER THE REFERRAL TO [facility].

## 2014-09-05 NOTE — Telephone Encounter (Signed)
rx ready 

## 2014-09-05 NOTE — Telephone Encounter (Signed)
[facility] will contact the patient for his appointment

## 2014-09-05 NOTE — Telephone Encounter (Signed)
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 Please, advise

## 2014-09-06 ENCOUNTER — Encounter: Payer: Self-pay | Admitting: Medical

## 2014-09-06 ENCOUNTER — Ambulatory Visit (INDEPENDENT_AMBULATORY_CARE_PROVIDER_SITE_OTHER): Payer: BLUE CROSS/BLUE SHIELD | Admitting: Medical

## 2014-09-06 ENCOUNTER — Telehealth: Payer: Self-pay | Admitting: Medical

## 2014-09-06 VITALS — BP 118/78 | HR 78 | Temp 97.9°F | Resp 15 | Wt 175.0 lb

## 2014-09-06 DIAGNOSIS — K648 Other hemorrhoids: Secondary | ICD-10-CM

## 2014-09-06 DIAGNOSIS — K644 Residual hemorrhoidal skin tags: Secondary | ICD-10-CM

## 2014-09-06 DIAGNOSIS — K6289 Other specified diseases of anus and rectum: Secondary | ICD-10-CM | POA: Diagnosis not present

## 2014-09-06 MED ORDER — HYDROCODONE-ACETAMINOPHEN 7.5-325 MG PO TABS: 1.0000 | ORAL_TABLET | Freq: Four times a day (QID) | ORAL | Status: DC | PRN

## 2014-09-06 MED ORDER — LIDOCAINE HCL 2 % EX GEL: 1.0000 "application " | CUTANEOUS | Status: AC | PRN

## 2014-09-06 NOTE — Telephone Encounter (Signed)
Appointment with Dr Elnoria HowardHung Monday July 11 2 9:15, patient aware, they do banding in office if needed.

## 2014-09-06 NOTE — Progress Notes (Signed)
Subjective: Here for recheck on hemorrhoids.  Still having lots of pain and discomfort.   The cream from last visit only helped a little.  Couldn't afford the suppositories.   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  He presents FMLA today as he can perform his normal work routine currently.   Has had hemorrhoid problems for years.  Since last visit using amitiza and stool softener which is helping his BMs in general.  Prior has used various OTC treatments including preparation H, uses daily OTC suppositories, has used creams OTC, stool softener ,and laxative occasionally.   No recent heavy lifting.  No prior hemorrhoid procedure or surgery.  No other aggravating or relieving factors. No other complaint.  Objective: BP 118/78 mmHg  Pulse 78  Temp(Src) 97.9 F (36.6 C) (Oral)  Resp 15  Wt 175 lb (79.379 kg)  Gen: wdwn, nad Abdomen: +bs, soft, nontender, no organomegaly Anus with 1 large and 1 small exernal non thrombosed hemorrhoid, the smaller hemorrhoid is somewhat erythematous and tender, no obvious thrombosis.  No obvious fissure.  No surrounding induration fluctuance or erythema   Assessment: Encounter Diagnoses  Name Primary?  . External hemorrhoid Yes  . Rectal pain      Plan Recommendations:  c/t Amitiza 24mcg twice daily for constipation.   Use Colace stool softener as needed  c/t 3-5 days of hydrocortisone cream  Lidocaine jelly topoical for comfort  C/t SITZ baths  Drink 64 + oz water daily  Try to get 25g of fiber daily in the diet  Hydrocodone for worse pain  Will refer to GI or surgeon ASAP   Marcus Powell was seen today for hemorrhoid.  Diagnoses and all orders for this visit:  External hemorrhoid  Rectal pain  Other orders -     HYDROcodone-acetaminophen (NORCO) 7.5-325 MG per tablet; Take 1 tablet by mouth every 6 (six) hours as needed for moderate pain. -     lidocaine (XYLOCAINE) 2 % jelly; Apply 1 application topically  as needed. External use at rectum

## 2014-09-06 NOTE — Telephone Encounter (Signed)
He is in pain, scheduled for this morning.

## 2014-09-06 NOTE — Telephone Encounter (Signed)
If he has a very painful hemorrhoid now, then recheck, in case its thrombosed.   If its just annoying and wants all of the tissue cut off, that is where surgery comes in to play.  Have him come back in for me to look

## 2014-09-06 NOTE — Telephone Encounter (Signed)
Call and see if Dr. Elnoria HowardHung or other GI can see him for hemorrhoid procedure.  He has 1 medium to large and 1 small tender hemorrhoid, that are painful, can't see surgeon til 09/19/14.   If the GI office dose band procedure, see if they can work him in the next day or 2.  Let me know as he presented an FMLA form today.

## 2014-09-07 NOTE — Telephone Encounter (Signed)
Scan and return FMLA.  No fee as form was submitted at time of appt.

## 2014-09-10 ENCOUNTER — Telehealth: Payer: Self-pay | Admitting: Internal Medicine

## 2014-09-10 NOTE — Telephone Encounter (Signed)
Its only been 3 days.   He is using this too frequently.   He should still have some Ultram for pain which was given/written the day before I saw him.  Plus, he was suppose to see GI today, make sure he did/does see GI today and they can address the pain.

## 2014-09-10 NOTE — Telephone Encounter (Signed)
Pt said that he is in so much pain that is why he is using so much. He said he went to GI and he has a tear and they did not give him any pain med. They gave him nitroglycerin. I told him he was using it to much and he said that the tramadol is like aspirin and not helping but he does have a few left. Pt wants to see know if you will give him any

## 2014-09-10 NOTE — Telephone Encounter (Signed)
1- he can always call back GI and let them know about his pain level and what else he can do 2- he needs to start the NTG topically as this is for a tear 3- he can use Ultram , 1-2 tablets q8hrs for pain, and he should have some Ultram left  The whole point of a referral is that they address the issues at hand with the specialist when what we are doing isn't working, so once it is in the specialist' hand, we let them manage the issue, in this case rectal pain

## 2014-09-10 NOTE — Telephone Encounter (Signed)
Pt needs a refill on his norco. Pt states he is taking it 1 every 6 hours and a couple times he has used it before 6 hours were up. Pt got a refill on 7/7 but is already out. Please call when ready. [phone]

## 2014-09-10 NOTE — Telephone Encounter (Signed)
Pt notified and understand everything

## 2014-11-16 ENCOUNTER — Telehealth: Payer: Self-pay | Admitting: Medical

## 2014-11-16 NOTE — Telephone Encounter (Signed)
Pt states has been to Dr. Elnoria Howard a couple times and doesn't feel like he is helping his hemorrhoid/fissure problem.  States it comes and goes.  He states he would like to see someone else and get this resolved.  Please call pt

## 2014-11-16 NOTE — Telephone Encounter (Signed)
Has he called back and expressed his concern and frustration with no resolution.  He needs to let Dr. Elnoria Howard know so Dr. Elnoria Howard can consider next steps.  Dr. Elnoria Howard is usually easy to work with, and he needs to be aware if the problem is not resolving.

## 2014-11-19 NOTE — Telephone Encounter (Signed)
Office notes from Dr. Elnoria Howard in your folder

## 2014-11-19 NOTE — Telephone Encounter (Signed)
Pt called and states he saw Dr. Elnoria Howard again today & he is going to try something for a about 2 weeks and if no improvement he will refer pt to surgeon.

## 2014-11-19 NOTE — Telephone Encounter (Signed)
Left message for pt to call.  Also recv'd Dr. Haywood Pao office note and he does state will have pt see surgery if no improvement.

## 2015-05-24 NOTE — Patient Instructions (Addendum)
Marcus RoamMark A Powell  05/24/2015   Your procedure is scheduled on: 3.31.2017    [facility]  [facility] to 3rd floor to  Short Stay Center at    0530 AM.  Call this number if you have problems the morning of surgery [phone]   Remember: ONLY 1 PERSON MAY GO WITH YOU TO SHORT STAY TO GET  READY MORNING OF YOUR SURGERY.  Do not eat food or drink liquids :After Midnight. (Follow bowel prep instructions per MD office-[phone] -call to get)   Take these medicines the morning of surgery with A SIP OF WATER: Oxycodone if need. DO NOT TAKE ANY DIABETIC MEDICATIONS DAY OF YOUR SURGERY                               You may not have any metal on your body including hair pins and              piercings  Do not wear jewelry,  lotions, powders or perfumes, deodorant                         Men may shave face and neck.   Do not bring valuables to the hospital. [facility] IS NOT             RESPONSIBLE   FOR VALUABLES.  Contacts, dentures or bridgework may not be worn into surgery.      Patients discharged the day of surgery will not be allowed to drive home.  Name and phone number of your driver: [name],fiancee- [phone]   Special Instructions: coughing and deep breathing exercises, leg exercises               Please read over the following fact sheets you were given: _____________________________________________________________________             [facility] - Pre[facility] Before surgery, you can play an important role.  Because skin is not sterile, your skin needs to be as free of germs as possible.  You can reduce the number of germs on your skin by washing with CHG (chlorahexidine gluconate) soap before surgery.  CHG is an antiseptic cleaner which kills germs and bonds with the skin to continue killing germs even after washing. Please DO NOT use if you have an allergy to CHG or antibacterial soaps.  If your  skin becomes reddened/irritated stop using the CHG and inform your nurse when you arrive at Short Stay. Do not shave (including legs and underarms) for at least 48 hours prior to the first CHG shower.  You may shave your face/neck. Please follow these instructions carefully:  1.  Shower with CHG Soap the night before surgery and the  morning of Surgery.  2.  If you choose to wash your hair, wash your hair first as usual with your  normal  shampoo.  3.  After you shampoo, rinse your hair and body thoroughly to remove the  shampoo.                           4.  Use CHG as you would any other liquid soap.  You can apply chg directly  to the skin and wash  Gently with a scrungie or clean washcloth.  5.  Apply the CHG Soap to your body ONLY FROM THE NECK DOWN.   Do not use on face/ open                           Wound or open sores. Avoid contact with eyes, ears mouth and genitals (private parts).                       Wash face,  Genitals (private parts) with your normal soap.             6.  Wash thoroughly, paying special attention to the area where your surgery  will be performed.  7.  Thoroughly rinse your body with warm water from the neck down.  8.  DO NOT shower/wash with your normal soap after using and rinsing off  the CHG Soap.                9.  Pat yourself dry with a clean towel.            10.  Wear clean pajamas.            11.  Place clean sheets on your bed the night of your first shower and do not  sleep with pets. Day of Surgery : Do not apply any lotions/deodorants the morning of surgery.  Please wear clean clothes to the hospital/surgery center.  FAILURE TO FOLLOW THESE INSTRUCTIONS MAY RESULT IN THE CANCELLATION OF YOUR SURGERY PATIENT SIGNATURE_________________________________  NURSE SIGNATURE__________________________________  ________________________________________________________________________

## 2015-05-27 ENCOUNTER — Encounter (HOSPITAL_COMMUNITY)
Admission: RE | Admit: 2015-05-27 | Discharge: 2015-05-27 | Disposition: A | Discharge status: Home / Self Care | Payer: BLUE CROSS/BLUE SHIELD | Source: Ambulatory Visit | Attending: Surgery | Admitting: Surgery

## 2015-05-27 ENCOUNTER — Encounter (HOSPITAL_COMMUNITY): Payer: Self-pay

## 2015-05-27 DIAGNOSIS — K649 Unspecified hemorrhoids: Secondary | ICD-10-CM | POA: Insufficient documentation

## 2015-05-27 DIAGNOSIS — Z01812 Encounter for preprocedural laboratory examination: Secondary | ICD-10-CM | POA: Insufficient documentation

## 2015-05-27 DIAGNOSIS — K602 Anal fissure, unspecified: Secondary | ICD-10-CM | POA: Diagnosis not present

## 2015-05-27 HISTORY — DX: Other specified diseases of anus and rectum: K62.89

## 2015-05-27 LAB — CBC
HCT: 41.4 % (ref 39.0–52.0)
Hemoglobin: 14 g/dL (ref 13.0–17.0)
MCH: 31 pg (ref 26.0–34.0)
MCHC: 33.8 g/dL (ref 30.0–36.0)
MCV: 91.8 fL (ref 78.0–100.0)
PLATELETS: 327 10*3/uL (ref 150–400)
RBC: 4.51 MIL/uL (ref 4.22–5.81)
RDW: 12.6 % (ref 11.5–15.5)
WBC: 13.7 10*3/uL — ABNORMAL HIGH (ref 4.0–10.5)

## 2015-05-27 NOTE — Progress Notes (Signed)
Need MD order in Epic- Pt. Needs instructions for any bowel prep called to him [phone].

## 2015-05-27 NOTE — Pre-Procedure Instructions (Signed)
EKG 4'16 Epic.

## 2015-05-31 ENCOUNTER — Ambulatory Visit (HOSPITAL_COMMUNITY)
Admission: RE | Admit: 2015-05-31 | Discharge: 2015-05-31 | Disposition: A | Discharge status: Home / Self Care | Payer: BLUE CROSS/BLUE SHIELD | Source: Ambulatory Visit | Attending: Surgery | Admitting: Surgery

## 2015-05-31 ENCOUNTER — Ambulatory Visit (HOSPITAL_COMMUNITY): Payer: BLUE CROSS/BLUE SHIELD | Admitting: Certified Registered Nurse Anesthetist

## 2015-05-31 ENCOUNTER — Encounter (HOSPITAL_COMMUNITY)
Admission: RE | Disposition: A | Discharge status: Home / Self Care | Payer: Self-pay | Source: Ambulatory Visit | Attending: Surgery

## 2015-05-31 ENCOUNTER — Encounter (HOSPITAL_COMMUNITY): Payer: Self-pay | Admitting: *Deleted

## 2015-05-31 DIAGNOSIS — K219 Gastro-esophageal reflux disease without esophagitis: Secondary | ICD-10-CM | POA: Diagnosis not present

## 2015-05-31 DIAGNOSIS — K602 Anal fissure, unspecified: Secondary | ICD-10-CM | POA: Insufficient documentation

## 2015-05-31 DIAGNOSIS — F17210 Nicotine dependence, cigarettes, uncomplicated: Secondary | ICD-10-CM | POA: Insufficient documentation

## 2015-05-31 DIAGNOSIS — F319 Bipolar disorder, unspecified: Secondary | ICD-10-CM | POA: Insufficient documentation

## 2015-05-31 DIAGNOSIS — K644 Residual hemorrhoidal skin tags: Secondary | ICD-10-CM | POA: Diagnosis present

## 2015-05-31 DIAGNOSIS — F429 Obsessive-compulsive disorder, unspecified: Secondary | ICD-10-CM | POA: Diagnosis not present

## 2015-05-31 HISTORY — PX: EVALUATION UNDER ANESTHESIA WITH HEMORRHOIDECTOMY: SHX5624

## 2015-05-31 SURGERY — EXAM UNDER ANESTHESIA WITH HEMORRHOIDECTOMY
Anesthesia: General | Site: Anus

## 2015-05-31 MED ORDER — FENTANYL CITRATE (PF) 100 MCG/2ML IJ SOLN
INTRAMUSCULAR | Status: DC | PRN
  Administered 2015-05-31: 50 ug via INTRAVENOUS
  Administered 2015-05-31: 25 ug via INTRAVENOUS
  Administered 2015-05-31 (×2): 50 ug via INTRAVENOUS
  Administered 2015-05-31: 25 ug via INTRAVENOUS

## 2015-05-31 MED ORDER — ONDANSETRON HCL 4 MG/2ML IJ SOLN: 4.0000 mg | Freq: Once | INTRAMUSCULAR | Status: DC | PRN

## 2015-05-31 MED ORDER — LACTATED RINGERS IV SOLN: INTRAVENOUS | Status: DC

## 2015-05-31 MED ORDER — FENTANYL CITRATE (PF) 100 MCG/2ML IJ SOLN
25.0000 ug | INTRAMUSCULAR | Status: DC | PRN
  Administered 2015-05-31 (×3): 50 ug via INTRAVENOUS

## 2015-05-31 MED ORDER — POVIDONE-IODINE 10 % EX OINT
TOPICAL_OINTMENT | CUTANEOUS | Status: DC | PRN
  Administered 2015-05-31: 1 via TOPICAL

## 2015-05-31 MED ORDER — LACTATED RINGERS IV SOLN
INTRAVENOUS | Status: DC
  Administered 2015-05-31: 07:00:00 via INTRAVENOUS

## 2015-05-31 MED ORDER — DEXTROSE 5 % IV SOLN
INTRAVENOUS | Status: AC
  Filled 2015-05-31: qty 2

## 2015-05-31 MED ORDER — OXYCODONE HCL 5 MG PO TABS
2.5000 mg | ORAL_TABLET | ORAL | Status: DC | PRN
  Administered 2015-05-31: 2.5 mg via ORAL
  Filled 2015-05-31: qty 1

## 2015-05-31 MED ORDER — LIDOCAINE HCL (CARDIAC) 20 MG/ML IV SOLN
INTRAVENOUS | Status: AC
  Filled 2015-05-31: qty 5

## 2015-05-31 MED ORDER — OXYCODONE-ACETAMINOPHEN 5-325 MG PO TABS
1.0000 | ORAL_TABLET | ORAL | Status: DC | PRN
  Administered 2015-05-31: 1 via ORAL
  Filled 2015-05-31: qty 1

## 2015-05-31 MED ORDER — PROPOFOL 10 MG/ML IV BOLUS
INTRAVENOUS | Status: DC | PRN
  Administered 2015-05-31: 170 mg via INTRAVENOUS

## 2015-05-31 MED ORDER — ONDANSETRON HCL 4 MG/2ML IJ SOLN
INTRAMUSCULAR | Status: AC
  Filled 2015-05-31: qty 2

## 2015-05-31 MED ORDER — FENTANYL CITRATE (PF) 100 MCG/2ML IJ SOLN
INTRAMUSCULAR | Status: DC
Start: 2015-05-31 — End: 2015-05-31
  Filled 2015-05-31: qty 2

## 2015-05-31 MED ORDER — MIDAZOLAM HCL 2 MG/2ML IJ SOLN
INTRAMUSCULAR | Status: AC
  Filled 2015-05-31: qty 2

## 2015-05-31 MED ORDER — FENTANYL CITRATE (PF) 100 MCG/2ML IJ SOLN
INTRAMUSCULAR | Status: AC
  Filled 2015-05-31: qty 2

## 2015-05-31 MED ORDER — PHENYLEPHRINE HCL 10 MG/ML IJ SOLN
INTRAMUSCULAR | Status: DC | PRN
  Administered 2015-05-31 (×2): 40 ug via INTRAVENOUS

## 2015-05-31 MED ORDER — FENTANYL CITRATE (PF) 100 MCG/2ML IJ SOLN
INTRAMUSCULAR | Status: AC
Start: 2015-05-31 — End: 2015-05-31
  Filled 2015-05-31: qty 2

## 2015-05-31 MED ORDER — LIDOCAINE HCL (CARDIAC) 20 MG/ML IV SOLN
INTRAVENOUS | Status: DC | PRN
  Administered 2015-05-31: 80 mg via INTRAVENOUS

## 2015-05-31 MED ORDER — ONDANSETRON HCL 4 MG/2ML IJ SOLN
INTRAMUSCULAR | Status: DC | PRN
  Administered 2015-05-31: 4 mg via INTRAVENOUS

## 2015-05-31 MED ORDER — DIBUCAINE 1 % RE OINT
TOPICAL_OINTMENT | RECTAL | Status: DC | PRN
  Administered 2015-05-31: 1 via RECTAL

## 2015-05-31 MED ORDER — SODIUM CHLORIDE 0.9 % IJ SOLN
INTRAMUSCULAR | Status: AC
  Filled 2015-05-31: qty 50

## 2015-05-31 MED ORDER — MIDAZOLAM HCL 5 MG/5ML IJ SOLN
INTRAMUSCULAR | Status: DC | PRN
  Administered 2015-05-31: 2 mg via INTRAVENOUS

## 2015-05-31 MED ORDER — DEXTROSE 5 % IV SOLN
2.0000 g | Freq: Once | INTRAVENOUS | Status: AC
  Administered 2015-05-31: 2 g via INTRAVENOUS

## 2015-05-31 MED ORDER — EPHEDRINE SULFATE 50 MG/ML IJ SOLN
INTRAMUSCULAR | Status: DC | PRN
  Administered 2015-05-31 (×3): 10 mg via INTRAVENOUS

## 2015-05-31 MED ORDER — EPHEDRINE SULFATE 50 MG/ML IJ SOLN
INTRAMUSCULAR | Status: AC
  Filled 2015-05-31: qty 1

## 2015-05-31 MED ORDER — OXYCODONE-ACETAMINOPHEN 7.5-325 MG PO TABS: 1.0000 | ORAL_TABLET | ORAL | Status: AC | PRN

## 2015-05-31 MED ORDER — SODIUM CHLORIDE 0.9 % IJ SOLN
INTRAMUSCULAR | Status: AC
  Filled 2015-05-31: qty 10

## 2015-05-31 MED ORDER — DIBUCAINE 1 % RE OINT
TOPICAL_OINTMENT | RECTAL | Status: AC
  Filled 2015-05-31: qty 28

## 2015-05-31 MED ORDER — DEXAMETHASONE SODIUM PHOSPHATE 10 MG/ML IJ SOLN
INTRAMUSCULAR | Status: AC
  Filled 2015-05-31: qty 1

## 2015-05-31 MED ORDER — PROPOFOL 10 MG/ML IV BOLUS
INTRAVENOUS | Status: AC
  Filled 2015-05-31: qty 20

## 2015-05-31 MED ORDER — BUPIVACAINE LIPOSOME 1.3 % IJ SUSP
20.0000 mL | Freq: Once | INTRAMUSCULAR | Status: AC
  Administered 2015-05-31: 20 mL
  Filled 2015-05-31: qty 20

## 2015-05-31 MED ORDER — POVIDONE-IODINE 10 % EX OINT
TOPICAL_OINTMENT | CUTANEOUS | Status: AC
  Filled 2015-05-31: qty 28.35

## 2015-05-31 SURGICAL SUPPLY — 27 items
BLADE HEX COATED 2.75 (ELECTRODE) ×3 IMPLANT
BLADE SURG 15 STRL LF DISP TIS (BLADE) ×1 IMPLANT
BLADE SURG 15 STRL SS (BLADE) ×2
BRIEF STRETCH FOR OB PAD LRG (UNDERPADS AND DIAPERS) ×3 IMPLANT
COVER SURGICAL LIGHT HANDLE (MISCELLANEOUS) ×3 IMPLANT
DECANTER SPIKE VIAL GLASS SM (MISCELLANEOUS) ×3 IMPLANT
DRSG PAD ABDOMINAL 8X10 ST (GAUZE/BANDAGES/DRESSINGS) ×3 IMPLANT
ELECT PENCIL ROCKER SW 15FT (MISCELLANEOUS) ×3 IMPLANT
ELECT REM PT RETURN 9FT ADLT (ELECTROSURGICAL) ×3
ELECTRODE REM PT RTRN 9FT ADLT (ELECTROSURGICAL) ×1 IMPLANT
GAUZE SPONGE 4X4 12PLY STRL (GAUZE/BANDAGES/DRESSINGS) ×3 IMPLANT
GAUZE SPONGE 4X4 16PLY XRAY LF (GAUZE/BANDAGES/DRESSINGS) ×3 IMPLANT
GLOVE BIOGEL M 8.0 STRL (GLOVE) ×3 IMPLANT
GOWN SPEC L4 XLG W/TWL (GOWN DISPOSABLE) ×3 IMPLANT
GOWN STRL REUS W/TWL XL LVL3 (GOWN DISPOSABLE) ×9 IMPLANT
KIT BASIN OR (CUSTOM PROCEDURE TRAY) ×3 IMPLANT
LUBRICANT JELLY K Y 4OZ (MISCELLANEOUS) ×3 IMPLANT
NEEDLE HYPO 22GX1.5 SAFETY (NEEDLE) ×3 IMPLANT
NS IRRIG 1000ML POUR BTL (IV SOLUTION) ×3 IMPLANT
PACK LITHOTOMY IV (CUSTOM PROCEDURE TRAY) ×3 IMPLANT
SHEARS HARMONIC 9CM CVD (BLADE) IMPLANT
SUT CHROMIC 2 0 SH (SUTURE) IMPLANT
SUT CHROMIC 3 0 SH 27 (SUTURE) IMPLANT
SYR 20CC LL (SYRINGE) ×3 IMPLANT
TOWEL OR 17X26 10 PK STRL BLUE (TOWEL DISPOSABLE) ×3 IMPLANT
UNDERPAD 30X30 INCONTINENT (UNDERPADS AND DIAPERS) ×3 IMPLANT
YANKAUER SUCT BULB TIP 10FT TU (MISCELLANEOUS) ×3 IMPLANT

## 2015-05-31 NOTE — Transfer of Care (Signed)
Immediate Anesthesia Transfer of Care Note  Patient: Marcus Powell  Procedure(s) Performed: Procedure(s): EXAM UNDER ANESTHESIA EXTERNAL HEMORRHOIDECTOMY left lateral spincterotomy (N/A)  Patient Location: PACU  Anesthesia Type:General  Level of Consciousness:  sedated, patient cooperative and responds to stimulation  Airway & Oxygen Therapy:Patient Spontanous Breathing and Patient connected to face mask oxgen  Post-op Assessment:  Report given to PACU RN and Post -op Vital signs reviewed and stable  Post vital signs:  Reviewed and stable  Last Vitals:  Filed Vitals:   05/31/15 0530  BP: 128/79  Pulse: 83  Temp: 36.3 C  Resp: 16    Complications: No apparent anesthesia complications

## 2015-05-31 NOTE — Anesthesia Postprocedure Evaluation (Signed)
Anesthesia Post Note  Patient: Rae RoamMark A Morace  Procedure(s) Performed: Procedure(s) (LRB): EXAM UNDER ANESTHESIA EXTERNAL HEMORRHOIDECTOMY left lateral spincterotomy (N/A)  Patient location during evaluation: PACU Anesthesia Type: General Level of consciousness: awake and alert Pain management: pain level controlled Vital Signs Assessment: post-procedure vital signs reviewed and stable Respiratory status: spontaneous breathing, nonlabored ventilation, respiratory function stable and patient connected to nasal cannula oxygen Cardiovascular status: blood pressure returned to baseline and stable Postop Assessment: no signs of nausea or vomiting Anesthetic complications: no    Last Vitals:  Filed Vitals:   05/31/15 0944 05/31/15 1056  BP: 125/72 140/80  Pulse: 70 76  Temp: 36.4 C 36.6 C  Resp: 12 18    Last Pain:  Filed Vitals:   05/31/15 1057  PainSc: 1                  Cecile HearingStephen Edward Trennon Torbeck

## 2015-05-31 NOTE — Op Note (Signed)
Surgeon: [name], MD, FACS  Asst:  none  Anes:  general  Procedure: Exam under anesthesia, left lateral sphincterotomy, external hemorrhoidectomy  Diagnosis: External hemorrhoids and posterior anal fissue  Complications: none  EBL:   minimal cc  Drains: none  Description of Procedure:  [facility]  After anesthesia was administered and the patient was prepped a timeout was performed.  The patient was in the dorsal lithotomy position.  Multiple large external hemorrhoids protruded out the anus.  Exam revealed a nonhealing posterior anal fissure.  A left lateral internal sphincterotomy was performed with a 15 blade to incision the distal 1 cm of internal sphincter.  The area was massaged to complete the sphincterotomy.  The hemorrhoids were excised with the Bovie.  Hemostasis was achieved.  The anal region was injected with 20 cc Exparel and massaged with betadine ointment.    The patient tolerated the procedure well and was taken to the PACU in stable condition.     Marcus B. Daphine DeutscherMartin, MD, [facility], [place] [phone]

## 2015-05-31 NOTE — Anesthesia Preprocedure Evaluation (Addendum)
Anesthesia Evaluation  Patient identified by MRN, date of birth, ID band Patient awake    Reviewed: Allergy & Precautions, NPO status , Patient's Chart, lab work & pertinent test results  History of Anesthesia Complications Negative for: history of anesthetic complications  Airway Mallampati: II  TM Distance: >3 FB Neck ROM: Full    Dental  (+) Teeth Intact, Dental Advisory Given, Caps, Missing,    Pulmonary Current Smoker (22 pack years),    Pulmonary exam normal breath sounds clear to auscultation       Cardiovascular Exercise Tolerance: Good negative cardio ROS Normal cardiovascular exam Rhythm:Regular Rate:Normal     Neuro/Psych PSYCHIATRIC DISORDERS Bipolar Disorder negative neurological ROS     GI/Hepatic Neg liver ROS, GERD  Medicated,Anal fissure and hemorrhoids   Endo/Other  negative endocrine ROS  Renal/GU negative Renal ROS     Musculoskeletal negative musculoskeletal ROS (+)   Abdominal   Peds  Hematology negative hematology ROS (+)   Anesthesia Other Findings Day of surgery medications reviewed with the patient.  Reproductive/Obstetrics                           Anesthesia Physical Anesthesia Plan  ASA: II  Anesthesia Plan: General   Post-op Pain Management:    Induction: Intravenous  Airway Management Planned: LMA  Additional Equipment:   Intra-op Plan:   Post-operative Plan: Extubation in OR  Informed Consent: I have reviewed the patients History and Physical, chart, labs and discussed the procedure including the risks, benefits and alternatives for the proposed anesthesia with the patient or authorized representative who has indicated his/her understanding and acceptance.   Dental advisory given  Plan Discussed with: CRNA  Anesthesia Plan Comments: (Risks/benefits of general anesthesia discussed with patient including risk of damage to teeth, lips, gum,  and tongue, nausea/vomiting, allergic reactions to medications, and the possibility of heart attack, stroke and death.  All patient questions answered.  Patient wishes to proceed.)       Anesthesia Quick Evaluation

## 2015-05-31 NOTE — Discharge Instructions (Addendum)
How to Take a Sitz Bath A sitz bath is a warm water bath that is taken while you are sitting down. The water should only come up to your hips and should cover your buttocks. Your health care provider may recommend a sitz bath to help you:   Clean the lower part of your body, including your genital area.  With itching.  With pain.  With sore muscles or muscles that tighten or spasm. HOW TO TAKE A SITZ BATH Take 3-4 sitz baths per day or as told by your health care provider.  Partially fill a bathtub with warm water. You will only need the water to be deep enough to cover your hips and buttocks when you are sitting in it.  If your health care provider told you to put medicine in the water, follow the directions exactly.  Sit in the water and open the tub drain a little.  Turn on the warm water again to keep the tub at the correct level. Keep the water running constantly.  Soak in the water for 15-20 minutes or as told by your health care provider.  After the sitz bath, pat the affected area dry first. Do not rub it.  Be careful when you stand up after the sitz bath because you may feel dizzy. SEEK MEDICAL CARE IF:  Your symptoms get worse. Do not continue with sitz baths if your symptoms get worse.  You have new symptoms. Do not continue with sitz baths until you talk with your health care provider.   This information is not intended to replace advice given to you by your health care provider. Make sure you discuss any questions you have with your health care provider.   Document Released: 11/09/2003 Document Revised: 07/03/2014 Document Reviewed: 02/14/2014 Elsevier Interactive Patient Education 2016 [employer] General Anesthesia, Adult, Care After Refer to this sheet in the next few weeks. These instructions provide you with information on caring for yourself after your procedure. Your health care provider may also give you more specific instructions. Your treatment has been  planned according to current medical practices, but problems sometimes occur. Call your health care provider if you have any problems or questions after your procedure. WHAT TO EXPECT AFTER THE PROCEDURE After the procedure, it is typical to experience:  Sleepiness.  Nausea and vomiting. HOME CARE INSTRUCTIONS  For the first 24 hours after general anesthesia:  Have a responsible person with you.  Do not drive a car. If you are alone, do not take public transportation.  Do not drink alcohol.  Do not take medicine that has not been prescribed by your health care provider.  Do not sign important papers or make important decisions.  You may resume a normal diet and activities as directed by your health care provider.  Change bandages (dressings) as directed.  If you have questions or problems that seem related to general anesthesia, call the hospital and ask for the anesthetist or anesthesiologist on call. SEEK MEDICAL CARE IF:  You have nausea and vomiting that continue the day after anesthesia.  You develop a rash. SEEK IMMEDIATE MEDICAL CARE IF:   You have difficulty breathing.  You have chest pain.  You have any allergic problems.   This information is not intended to replace advice given to you by your health care provider. Make sure you discuss any questions you have with your health care provider.   Document Released: 05/25/2000 Document Revised: 03/09/2014 Document Reviewed: 06/17/2011 Elsevier Interactive Patient Education  2016 Elsevier Inc. ° °

## 2015-05-31 NOTE — Anesthesia Procedure Notes (Signed)
Procedure Name: LMA Insertion Date/Time: 05/31/2015 7:47 AM Performed by: Kym GroomWALKER, Eiza Canniff L Pre-anesthesia Checklist: Patient identified, Emergency Drugs available, Suction available, Patient being monitored and Timeout performed Patient Re-evaluated:Patient Re-evaluated prior to inductionOxygen Delivery Method: Circle system utilized Preoxygenation: Pre-oxygenation with 100% oxygen Intubation Type: IV induction Ventilation: Mask ventilation without difficulty LMA: LMA inserted LMA Size: 4.0 Number of attempts: 1 Tube secured with: Tape Dental Injury: Teeth and Oropharynx as per pre-operative assessment

## 2015-05-31 NOTE — H&P (Signed)
Chief Complaint:  Pain after defecation  History of Present Illness:  Marcus Powell is an 50 y.o. 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  He presents for EUA and probable sphincterotomy and external hemorrhoidectomy.  [place].    Past Medical History  Diagnosis Date  . OCD (obsessive compulsive disorder)   . Bipolar disorder (HCC)     hx/o hospitalization - [facility]-no meds being taken at this time "takes no problems in 20 yrs"  . GERD (gastroesophageal reflux disease)     Uses Omeprazole as needed  . Anal or rectal pain     hemorrhoids, " history of anal fissure"    Past Surgical History  Procedure Laterality Date  . Wisdom tooth extraction    . Colonoscopy      Current Facility-Administered Medications  Medication Dose Route Frequency Provider Last Rate Last Dose  . lactated ringers infusion   Intravenous Continuous Luretha MurphyMatthew Yamna Mackel, MD      . lactated ringers infusion   Intravenous Continuous Luretha MurphyMatthew Alver Leete, MD       Review of patient's allergies indicates no known allergies. History reviewed. No pertinent family history. Social History:   reports that he has been smoking Cigarettes.  He has a 22 pack-year smoking history. He does not have any smokeless tobacco history on file. He reports that he drinks alcohol. He reports that he does not use illicit drugs.   REVIEW OF SYSTEMS : Negative except for see problem list  Physical Exam:   Blood pressure 128/79, pulse 83, temperature 97.3 F (36.3 C), temperature source Oral, resp. rate 16, height 6\' 4"  (1.93 m), weight 78.019 kg (172 lb), SpO2 100 %. Body mass index is 20.95 kg/(m^2).  Gen:  WDWN WM NAD  Neurological: Alert and oriented to person, place, and time. Motor and sensory function is grossly intact  Head: Normocephalic and atraumatic.  Eyes: Conjunctivae are normal. Pupils are equal, round, and reactive to light.  No scleral icterus.  Neck: Normal range of motion. Neck supple. No tracheal deviation or thyromegaly present.  Cardiovascular:  SR without murmurs or gallops.  No carotid bruits Breast:  Not examined Respiratory: Effort normal.  No respiratory distress. No chest wall tenderness. Breath sounds normal.  No wheezes, rales or rhonchi.  Abdomen:  nontender GU:  Anal with skin tags; unable to examine in the [place] office Musculoskeletal: Normal range of motion. Extremities are nontender. No cyanosis, edema or clubbing noted Lymphadenopathy: No cervical, preauricular, postauricular or axillary adenopathy is present Skin: Skin is warm and dry. No rash noted. No diaphoresis. No erythema. No pallor. Pscyh: Normal mood and affect. Behavior is normal. Judgment and thought content normal.   LABORATORY RESULTS: No results found for this or any previous visit (from the past 48 hour(s)).   RADIOLOGY RESULTS: No results found.  Problem List: Patient Active Problem List   Diagnosis Date Noted  . OCD (obsessive compulsive disorder) 05/19/2011  . Bipolar disorder (HCC) 04/29/2011  . Viral infection characterized by skin and mucous membrane lesions 02/04/2011  . Erectile dysfunction 02/04/2011  . 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 Daphine DeutscherMartin, MD, [facility]  [facility] [phone] beeper [phone]  05/31/2015 7:28 AM

## 2015-06-03 ENCOUNTER — Encounter (HOSPITAL_COMMUNITY): Payer: Self-pay | Admitting: Surgery

## 2016-01-01 ENCOUNTER — Ambulatory Visit (INDEPENDENT_AMBULATORY_CARE_PROVIDER_SITE_OTHER): Payer: BLUE CROSS/BLUE SHIELD | Admitting: Licensed Clinical Social Worker

## 2016-01-01 DIAGNOSIS — F3173 Bipolar disorder, in partial remission, most recent episode manic: Secondary | ICD-10-CM | POA: Diagnosis not present

## 2016-01-17 ENCOUNTER — Ambulatory Visit: Payer: Self-pay | Admitting: Licensed Clinical Social Worker

## 2017-12-06 ENCOUNTER — Other Ambulatory Visit: Payer: Self-pay | Admitting: Sports Medicine

## 2017-12-06 DIAGNOSIS — M542 Cervicalgia: Secondary | ICD-10-CM

## 2017-12-24 ENCOUNTER — Ambulatory Visit
Admission: RE | Admit: 2017-12-24 | Discharge: 2017-12-24 | Disposition: A | Discharge status: Home / Self Care | Payer: Commercial Managed Care - PPO | Source: Ambulatory Visit | Attending: Sports Medicine | Admitting: Sports Medicine

## 2017-12-24 DIAGNOSIS — M542 Cervicalgia: Secondary | ICD-10-CM

## 2017-12-24 MED ORDER — IOPAMIDOL (ISOVUE-M 300) INJECTION 61%
1.0000 mL | Freq: Once | INTRAMUSCULAR | Status: AC | PRN
  Administered 2017-12-24: 1 mL via EPIDURAL

## 2017-12-24 MED ORDER — TRIAMCINOLONE ACETONIDE 40 MG/ML IJ SUSP (RADIOLOGY)
60.0000 mg | Freq: Once | INTRAMUSCULAR | Status: AC
  Administered 2017-12-24: 60 mg via EPIDURAL

## 2017-12-24 NOTE — Discharge Instructions (Signed)

## 2019-06-15 IMAGING — XA DG INJECT/[PERSON_NAME] INC NEEDLE/CATH/PLC EPI/CERV/THOR W/IMG
2 series · 2 of 2 positions shown · non-contrast
Comparison: none

CLINICAL DATA: Cervical spondylosis without myelopathy. LEFT arm
radicular symptoms.

[Series 1: ortho standard · 1 of 1 slices shown (1 of 2)]
[im 1/1]
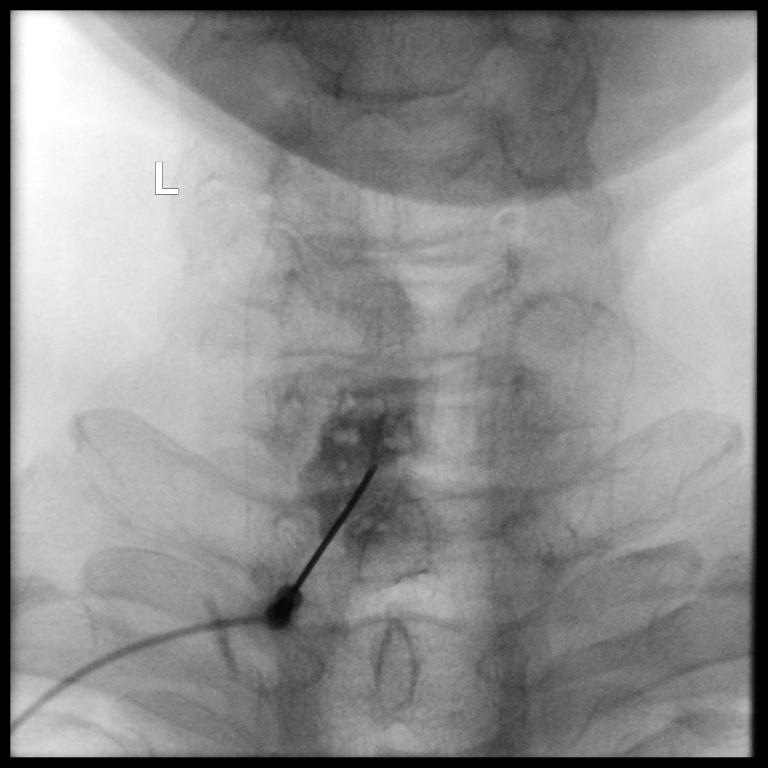

[Series 2: ortho standard · 1 of 1 slices shown (2 of 2)]
[im 1/1]
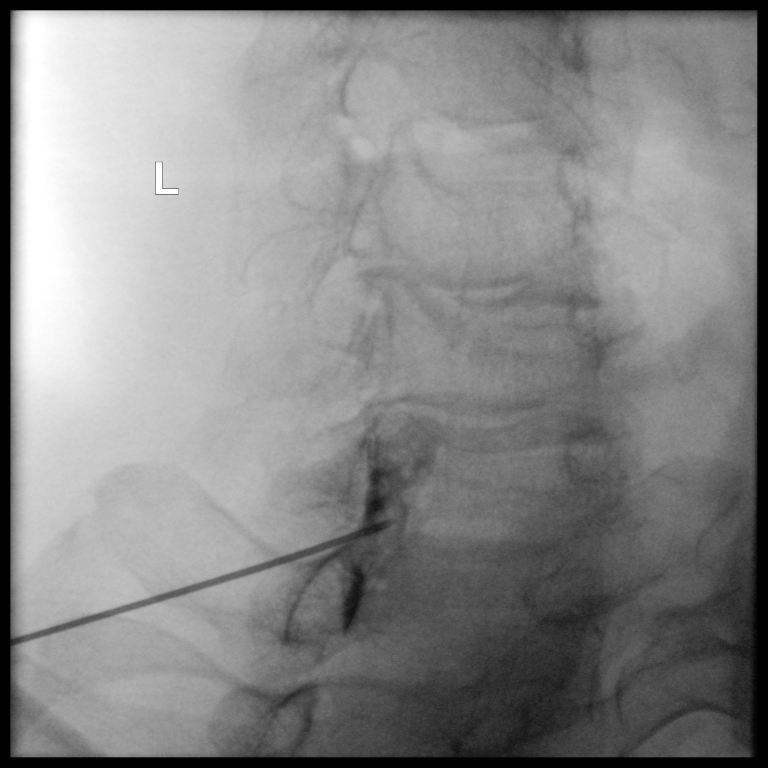

[2 of 2 positions shown; findings below may reference images not displayed]

FLUOROSCOPY TIME:  13 seconds corresponding to a Dose Area Product
of 9.39 Gy*m2

PROCEDURE:
Informed written consent was obtained.  Time-out was performed.

An appropriate skin entry site was chosen, cleansed with Betadine,
and anesthetized with 1% lidocaine.

CERVICAL EPIDURAL INJECTION

An interlaminar approach was performed on the LEFT at C7-T1. A 20
gauge epidural needle was advanced using loss-of-resistance
technique.

DIAGNOSTIC EPIDURAL INJECTION

Injection of Isovue-M 300 shows a good epidural pattern with spread
above and below the level of needle placement, primarily on the
LEFT. No vascular opacification is seen.

THERAPEUTIC EPIDURAL INJECTION

1.5 ml of Kenalog 40 mixed with 1 ml of 1% Lidocaine and 2 ml of
normal saline were then instilled. The procedure was well-tolerated,
and the patient was discharged thirty minutes following the
injection in good condition.
IMPRESSION: Technically successful first epidural injection on the LEFT at
C7-T1.

## 2021-09-01 ENCOUNTER — Other Ambulatory Visit: Payer: Self-pay | Admitting: Family Medicine

## 2021-09-01 DIAGNOSIS — F17200 Nicotine dependence, unspecified, uncomplicated: Secondary | ICD-10-CM

## 2021-10-09 ENCOUNTER — Ambulatory Visit
Admission: RE | Admit: 2021-10-09 | Discharge: 2021-10-09 | Disposition: A | Discharge status: Home / Self Care | Payer: Commercial Managed Care - PPO | Source: Ambulatory Visit | Attending: Family Medicine | Admitting: Family Medicine

## 2021-10-09 DIAGNOSIS — F17200 Nicotine dependence, unspecified, uncomplicated: Secondary | ICD-10-CM

## 2022-10-13 ENCOUNTER — Other Ambulatory Visit: Payer: Self-pay | Admitting: Family Medicine

## 2022-10-13 DIAGNOSIS — F17200 Nicotine dependence, unspecified, uncomplicated: Secondary | ICD-10-CM

## 2022-10-30 ENCOUNTER — Ambulatory Visit
Admission: RE | Admit: 2022-10-30 | Discharge: 2022-10-30 | Disposition: A | Discharge status: Home / Self Care | Payer: Commercial Managed Care - PPO | Source: Ambulatory Visit | Attending: Family Medicine | Admitting: Family Medicine

## 2022-10-30 DIAGNOSIS — F17200 Nicotine dependence, unspecified, uncomplicated: Secondary | ICD-10-CM
